# Patient Record
Sex: Female | Born: 1955 | Hispanic: Yes | Smoking: Never smoker
Health system: Southern US, Community
[De-identification: ages and names within clinical notes are randomized; demographics above are authoritative.]

## PROBLEM LIST (undated history)

## (undated) DIAGNOSIS — I10 Essential (primary) hypertension: Secondary | ICD-10-CM

## (undated) HISTORY — DX: Essential (primary) hypertension: I10

## (undated) HISTORY — PX: BLADDER REMOVAL: SHX567

## (undated) HISTORY — PX: CHOLECYSTECTOMY: SHX55

## (undated) HISTORY — PX: TUBAL LIGATION: SHX77

---

## 2014-11-12 LAB — HM COLONOSCOPY

## 2019-03-15 ENCOUNTER — Other Ambulatory Visit: Payer: Self-pay

## 2019-03-15 ENCOUNTER — Ambulatory Visit (INDEPENDENT_AMBULATORY_CARE_PROVIDER_SITE_OTHER): Payer: Medicare HMO | Admitting: Adult Health

## 2019-03-15 ENCOUNTER — Encounter: Payer: Self-pay | Admitting: Adult Health

## 2019-03-15 VITALS — BP 141/80 | HR 83 | Temp 97.4°F | Resp 16 | Ht 62.0 in | Wt 175.0 lb

## 2019-03-15 DIAGNOSIS — Z13820 Encounter for screening for osteoporosis: Secondary | ICD-10-CM

## 2019-03-15 DIAGNOSIS — Z1231 Encounter for screening mammogram for malignant neoplasm of breast: Secondary | ICD-10-CM | POA: Diagnosis not present

## 2019-03-15 DIAGNOSIS — Z6832 Body mass index (BMI) 32.0-32.9, adult: Secondary | ICD-10-CM

## 2019-03-15 DIAGNOSIS — I10 Essential (primary) hypertension: Secondary | ICD-10-CM

## 2019-03-15 DIAGNOSIS — Z7689 Persons encountering health services in other specified circumstances: Secondary | ICD-10-CM

## 2019-03-15 MED ORDER — LOSARTAN POTASSIUM 25 MG PO TABS: 25.0000 mg | ORAL_TABLET | Freq: Every day | ORAL | 0 refills | Status: DC

## 2019-03-15 NOTE — Progress Notes (Signed)
[facility] [place], [place] [address]  Internal MEDICINE  Office Visit Note  Patient Name: Krystal Boyd  [id number]  [id number]  Date of Service: 05/01/2019   Complaints/HPI Pt is here for establishment of PCP. Chief Complaint  Patient presents with  . New Patient (Initial Visit)  . Hypertension   HPI Pt is here to establish care. She is in the exam room with her husband.  They just recently moved to the area from [place]. She has a history of HTN, for which she currently takes losartan 25mg  daily.  She Denies Chest pain, Shortness of breath, palpitations, headache, or blurred vision. Her bp is initially 141/80 today.  She reports good medication compliance. The only surgical history she reports is gall bladder removal.  She denies any other medical problems.  Does not believe she has ever been on other medications daily.      Current Medication: Outpatient Encounter Medications as of 03/15/2019  Medication Sig  . losartan (COZAAR) 25 MG tablet Take 1 tablet (25 mg total) by mouth daily.  . [DISCONTINUED] losartan (COZAAR) 25 MG tablet Take 25 mg by mouth daily.   No facility-administered encounter medications on file as of 03/15/2019.    Surgical History: Past Surgical History:  Procedure Laterality Date  . BLADDER REMOVAL    . CHOLECYSTECTOMY      Medical History: Past Medical History:  Diagnosis Date  . Hypertension     Family History: Family History  Problem Relation Age of Onset  . Diabetes Mother   . Breast cancer Sister     Social History   Socioeconomic History  . Marital status: Married    Spouse name: Not on file  . Number of children: Not on file  . Years of education: Not on file  . Highest education level: Not on file  Occupational History  . Not on file  Tobacco Use  . Smoking status: Never Smoker  . Smokeless tobacco: Never Used  Substance and Sexual Activity  . Alcohol use: Yes    Comment: occ  . Drug use: Never   . Sexual activity: Not on file  Other Topics Concern  . Not on file  Social History Narrative  . Not on file   Social Determinants of Health   Financial Resource Strain:   . Difficulty of Paying Living Expenses:   Food Insecurity:   . Worried About Charity fundraiser in the Last Year:   . Arboriculturist in the Last Year:   Transportation Needs:   . Film/video editor (Medical):   Marland Kitchen Lack of Transportation (Non-Medical):   Physical Activity:   . Days of Exercise per Week:   . Minutes of Exercise per Session:   Stress:   . Feeling of Stress :   Social Connections:   . Frequency of Communication with Friends and Family:   . Frequency of Social Gatherings with Friends and Family:   . Attends Religious Services:   . Active Member of Clubs or Organizations:   . Attends Archivist Meetings:   Marland Kitchen Marital Status:   Intimate Partner Violence:   . Fear of Current or Ex-Partner:   . Emotionally Abused:   Marland Kitchen Physically Abused:   . Sexually Abused:      Review of Systems  Constitutional: Negative for chills, fatigue and unexpected weight change.  HENT: Negative for congestion, rhinorrhea, sneezing and sore throat.   Eyes: Negative for photophobia, pain and redness.  Respiratory: Negative for cough, chest tightness and shortness of breath.   Cardiovascular: Negative for chest pain and palpitations.  Gastrointestinal: Negative for abdominal pain, constipation, diarrhea, nausea and vomiting.  Endocrine: Negative.   Genitourinary: Negative for dysuria and frequency.  Musculoskeletal: Negative for arthralgias, back pain, joint swelling and neck pain.  Skin: Negative for rash.  Allergic/Immunologic: Negative.   Neurological: Negative for tremors and numbness.  Hematological: Negative for adenopathy. Does not bruise/bleed easily.  Psychiatric/Behavioral: Negative for behavioral problems and sleep disturbance. The patient is not nervous/anxious.     Vital Signs: BP (!)  141/80 Comment: second reading  Pulse 83   Temp (!) 97.4 F (36.3 C)   Resp 16   Ht 5\' 2"  (1.575 m)   Wt 175 lb (79.4 kg)   SpO2 97%   BMI 32.01 kg/m    Physical Exam Vitals and nursing note reviewed.  Constitutional:      General: She is not in acute distress.    Appearance: She is well-developed. She is not diaphoretic.  HENT:     Head: Normocephalic and atraumatic.     Mouth/Throat:     Pharynx: No oropharyngeal exudate.  Eyes:     Pupils: Pupils are equal, round, and reactive to light.  Neck:     Thyroid: No thyromegaly.     Vascular: No JVD.     Trachea: No tracheal deviation.  Cardiovascular:     Rate and Rhythm: Normal rate and regular rhythm.     Heart sounds: Normal heart sounds. No murmur. No friction rub. No gallop.   Pulmonary:     Effort: Pulmonary effort is normal. No respiratory distress.     Breath sounds: Normal breath sounds. No wheezing or rales.  Chest:     Chest wall: No tenderness.  Abdominal:     Palpations: Abdomen is soft.     Tenderness: There is no abdominal tenderness. There is no guarding.  Musculoskeletal:        General: Normal range of motion.     Cervical back: Normal range of motion and neck supple.  Lymphadenopathy:     Cervical: No cervical adenopathy.  Skin:    General: Skin is warm and dry.  Neurological:     Mental Status: She is alert and oriented to person, place, and time.     Cranial Nerves: No cranial nerve deficit.  Psychiatric:        Behavior: Behavior normal.        Thought Content: Thought content normal.        Judgment: Judgment normal.    Assessment/Plan: 1. Essential hypertension Refilled patients losartan as discussed.  Continue present management.  - losartan (COZAAR) 25 MG tablet; Take 1 tablet (25 mg total) by mouth daily.  Dispense: 90 tablet; Refill: 0  2. BMI 32.0-32.9,adult Obesity Counseling: Risk Assessment: An assessment of behavioral risk factors was made today and includes lack of exercise  sedentary lifestyle, lack of portion control and poor dietary habits.  Risk Modification Advice: She was counseled on portion control guidelines. Restricting daily caloric intake to 1800. The detrimental long term effects of obesity on her health and ongoing poor compliance was also discussed with the patient.  3. Encounter for screening mammogram for malignant neoplasm of breast - MM DIGITAL SCREENING BILATERAL; Future  4. Screening for osteoporosis - DG Bone Density; Future  5. Encounter to establish care with new doctor - CBC with Differential/Platelet - Lipid Panel With LDL/HDL Ratio - TSH - T4,  free - Comprehensive metabolic panel - Hemoglobin A1c - B12 and Folate Panel - Vitamin D (25 hydroxy) - Fe+TIBC+Fer  General Counseling: Rickita verbalizes understanding of the findings of todays visit and agrees with plan of treatment. I have discussed any further diagnostic evaluation that may be needed or ordered today. We also reviewed her medications today. she has been encouraged to call the office with any questions or concerns that should arise related to todays visit.  Orders Placed This Encounter  Procedures  . DG Bone Density  . CBC with Differential/Platelet  . Lipid Panel With LDL/HDL Ratio  . TSH  . T4, free  . Comprehensive metabolic panel  . Hemoglobin A1c  . B12 and Folate Panel  . Vitamin D (25 hydroxy)  . Fe+TIBC+Fer    Meds ordered this encounter  Medications  . losartan (COZAAR) 25 MG tablet    Sig: Take 1 tablet (25 mg total) by mouth daily.    Dispense:  90 tablet    Refill:  0    Time spent: 30 Minutes   This patient was seen by Blima Ledger AGNP-C in Collaboration with Dr Lyndon Code as a part of collaborative care agreement  Johnna Acosta AGNP-C Internal Medicine

## 2019-03-16 DIAGNOSIS — E755 Other lipid storage disorders: Secondary | ICD-10-CM | POA: Diagnosis not present

## 2019-03-16 DIAGNOSIS — E611 Iron deficiency: Secondary | ICD-10-CM | POA: Diagnosis not present

## 2019-03-16 DIAGNOSIS — D510 Vitamin B12 deficiency anemia due to intrinsic factor deficiency: Secondary | ICD-10-CM | POA: Diagnosis not present

## 2019-03-16 DIAGNOSIS — D509 Iron deficiency anemia, unspecified: Secondary | ICD-10-CM | POA: Diagnosis not present

## 2019-03-16 DIAGNOSIS — R5383 Other fatigue: Secondary | ICD-10-CM | POA: Diagnosis not present

## 2019-03-16 DIAGNOSIS — Z833 Family history of diabetes mellitus: Secondary | ICD-10-CM | POA: Diagnosis not present

## 2019-03-16 DIAGNOSIS — Z7689 Persons encountering health services in other specified circumstances: Secondary | ICD-10-CM | POA: Diagnosis not present

## 2019-03-17 LAB — CBC WITH DIFFERENTIAL/PLATELET
Basophils Absolute: 0.1 10*3/uL (ref 0.0–0.2)
Basos: 1 %
EOS (ABSOLUTE): 0.2 10*3/uL (ref 0.0–0.4)
Eos: 2 %
Hematocrit: 43.5 % (ref 34.0–46.6)
Hemoglobin: 14.3 g/dL (ref 11.1–15.9)
Immature Grans (Abs): 0 10*3/uL (ref 0.0–0.1)
Immature Granulocytes: 0 %
Lymphocytes Absolute: 2.8 10*3/uL (ref 0.7–3.1)
Lymphs: 41 %
MCH: 26.9 pg (ref 26.6–33.0)
MCHC: 32.9 g/dL (ref 31.5–35.7)
MCV: 82 fL (ref 79–97)
Monocytes Absolute: 0.5 10*3/uL (ref 0.1–0.9)
Monocytes: 8 %
Neutrophils Absolute: 3.4 10*3/uL (ref 1.4–7.0)
Neutrophils: 48 %
Platelets: 274 10*3/uL (ref 150–450)
RBC: 5.31 x10E6/uL — ABNORMAL HIGH (ref 3.77–5.28)
RDW: 13.5 % (ref 11.7–15.4)
WBC: 6.9 10*3/uL (ref 3.4–10.8)

## 2019-03-17 LAB — IRON,TIBC AND FERRITIN PANEL
Ferritin: 143 ng/mL (ref 15–150)
Iron Saturation: 33 % (ref 15–55)
Iron: 110 ug/dL (ref 27–139)
Total Iron Binding Capacity: 331 ug/dL (ref 250–450)
UIBC: 221 ug/dL (ref 118–369)

## 2019-03-17 LAB — B12 AND FOLATE PANEL
Folate: >20 ng/mL (ref >3.0)
Vitamin B-12: 647 pg/mL (ref 232–1245)

## 2019-03-17 LAB — COMPREHENSIVE METABOLIC PANEL
ALT: 17 IU/L (ref 0–32)
AST: 17 IU/L (ref 0–40)
Albumin/Globulin Ratio: 1.4 (ref 1.2–2.2)
Albumin: 4.2 g/dL (ref 3.8–4.8)
Alkaline Phosphatase: 96 IU/L (ref 39–117)
BUN/Creatinine Ratio: 17 (ref 12–28)
BUN: 11 mg/dL (ref 8–27)
Bilirubin Total: 0.3 mg/dL (ref 0.0–1.2)
CO2: 19 mmol/L — ABNORMAL LOW (ref 20–29)
Calcium: 9.3 mg/dL (ref 8.7–10.3)
Chloride: 104 mmol/L (ref 96–106)
Creatinine, Ser: 0.65 mg/dL (ref 0.57–1.00)
GFR calc Af Amer: 108 mL/min/{1.73_m2} (ref >59)
GFR calc non Af Amer: 93 mL/min/{1.73_m2} (ref >59)
Globulin, Total: 2.9 g/dL (ref 1.5–4.5)
Glucose: 133 mg/dL — ABNORMAL HIGH (ref 65–99)
Potassium: 4.7 mmol/L (ref 3.5–5.2)
Sodium: 139 mmol/L (ref 134–144)
Total Protein: 7.1 g/dL (ref 6.0–8.5)

## 2019-03-17 LAB — LIPID PANEL WITH LDL/HDL RATIO
Cholesterol, Total: 181 mg/dL (ref 100–199)
HDL: 41 mg/dL (ref >39)
LDL Chol Calc (NIH): 115 mg/dL — ABNORMAL HIGH (ref 0–99)
LDL/HDL Ratio: 2.8 ratio (ref 0.0–3.2)
Triglycerides: 140 mg/dL (ref 0–149)
VLDL Cholesterol Cal: 25 mg/dL (ref 5–40)

## 2019-03-17 LAB — T4, FREE: Free T4: 1.27 ng/dL (ref 0.82–1.77)

## 2019-03-17 LAB — HEMOGLOBIN A1C
Est. average glucose Bld gHb Est-mCnc: 137 mg/dL
Hgb A1c MFr Bld: 6.4 % — ABNORMAL HIGH (ref 4.8–5.6)

## 2019-03-17 LAB — VITAMIN D 25 HYDROXY (VIT D DEFICIENCY, FRACTURES): Vit D, 25-Hydroxy: 38.2 ng/mL (ref 30.0–100.0)

## 2019-03-17 LAB — TSH: TSH: 2.5 u[IU]/mL (ref 0.450–4.500)

## 2019-04-04 ENCOUNTER — Other Ambulatory Visit: Payer: Self-pay | Admitting: Adult Health

## 2019-04-04 DIAGNOSIS — Z1231 Encounter for screening mammogram for malignant neoplasm of breast: Secondary | ICD-10-CM

## 2019-04-25 ENCOUNTER — Encounter: Payer: Self-pay | Admitting: Radiology

## 2019-04-25 ENCOUNTER — Ambulatory Visit
Admission: RE | Admit: 2019-04-25 | Discharge: 2019-04-25 | Disposition: A | Discharge status: Home / Self Care | Payer: Medicare HMO | Source: Ambulatory Visit | Attending: Adult Health | Admitting: Adult Health

## 2019-04-25 ENCOUNTER — Telehealth: Payer: Self-pay

## 2019-04-25 DIAGNOSIS — Z13820 Encounter for screening for osteoporosis: Secondary | ICD-10-CM | POA: Diagnosis not present

## 2019-04-25 DIAGNOSIS — Z1231 Encounter for screening mammogram for malignant neoplasm of breast: Secondary | ICD-10-CM | POA: Diagnosis not present

## 2019-04-25 DIAGNOSIS — Z780 Asymptomatic menopausal state: Secondary | ICD-10-CM | POA: Diagnosis not present

## 2019-04-25 NOTE — Telephone Encounter (Signed)
lmom to confirm and screen for 04-27-19 ov. 

## 2019-04-27 ENCOUNTER — Ambulatory Visit: Payer: Medicare HMO | Admitting: Nurse Practitioner

## 2019-04-28 ENCOUNTER — Telehealth: Payer: Self-pay

## 2019-04-28 NOTE — Telephone Encounter (Signed)
BILLED MISSED APPOINTMENT FEE 04/27/2019

## 2019-05-03 NOTE — Progress Notes (Signed)
Pt has missed app, will dicsuss BMD on her visit for CPE

## 2019-05-15 ENCOUNTER — Telehealth: Payer: Self-pay

## 2019-05-15 NOTE — Telephone Encounter (Signed)
Lmom for patient to contact our office to rs missed appt on 04/27/2019.

## 2019-06-06 ENCOUNTER — Other Ambulatory Visit: Payer: Self-pay | Admitting: Adult Health

## 2019-06-06 DIAGNOSIS — I10 Essential (primary) hypertension: Secondary | ICD-10-CM

## 2019-06-07 ENCOUNTER — Telehealth: Payer: Self-pay

## 2019-06-07 NOTE — Telephone Encounter (Signed)
Tried contacting patient to reschedule missed appointment for annual wellness visit. klh

## 2020-05-15 IMAGING — MG DIGITAL SCREENING BILAT W/ TOMO W/ CAD
8 series · 8 of 24 positions shown · non-contrast
Comparison: None.

CLINICAL DATA: Screening.

EXAM:
DIGITAL SCREENING BILATERAL MAMMOGRAM WITH TOMO AND CAD

[L MLO synth-2D]
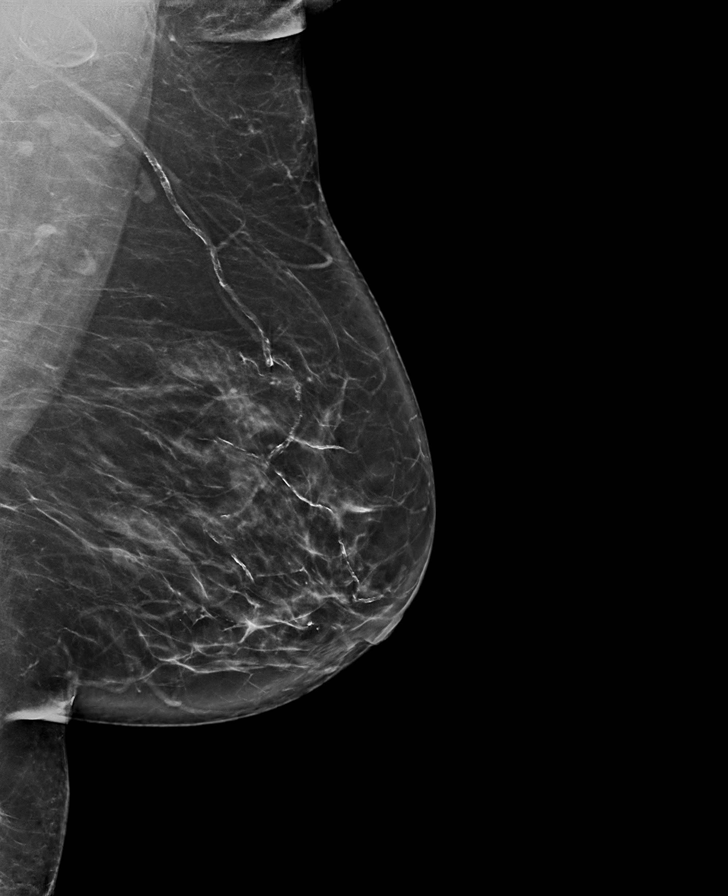

[L CC synth-2D]
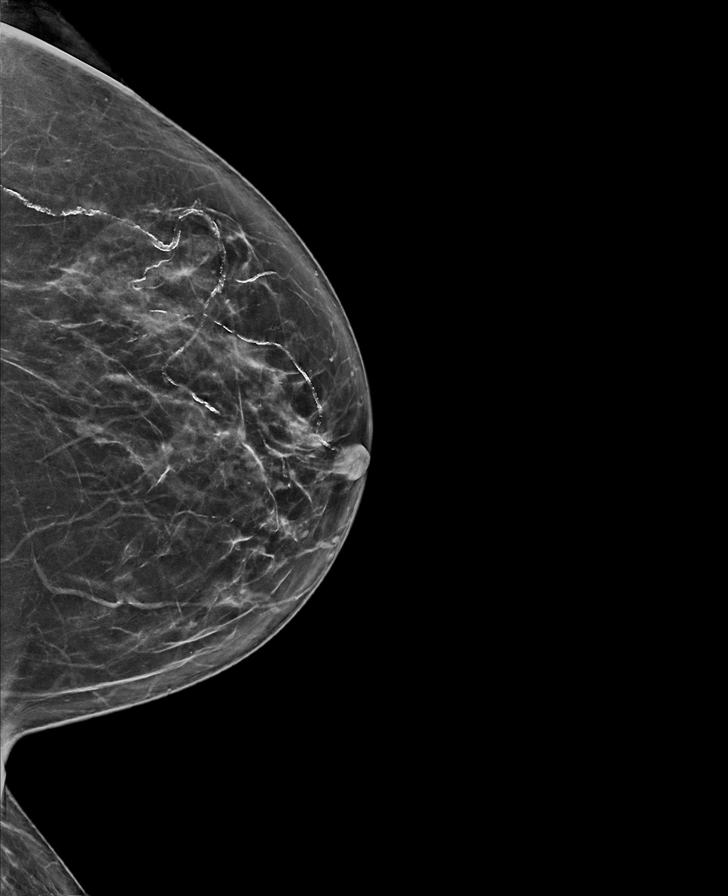

[R MLO synth-2D]
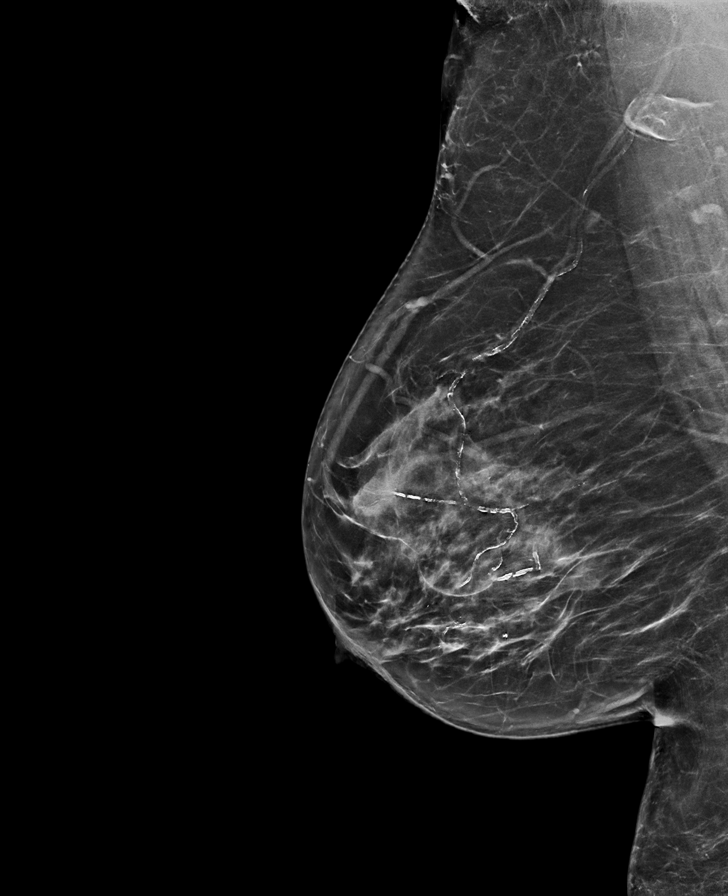

[R CC synth-2D]
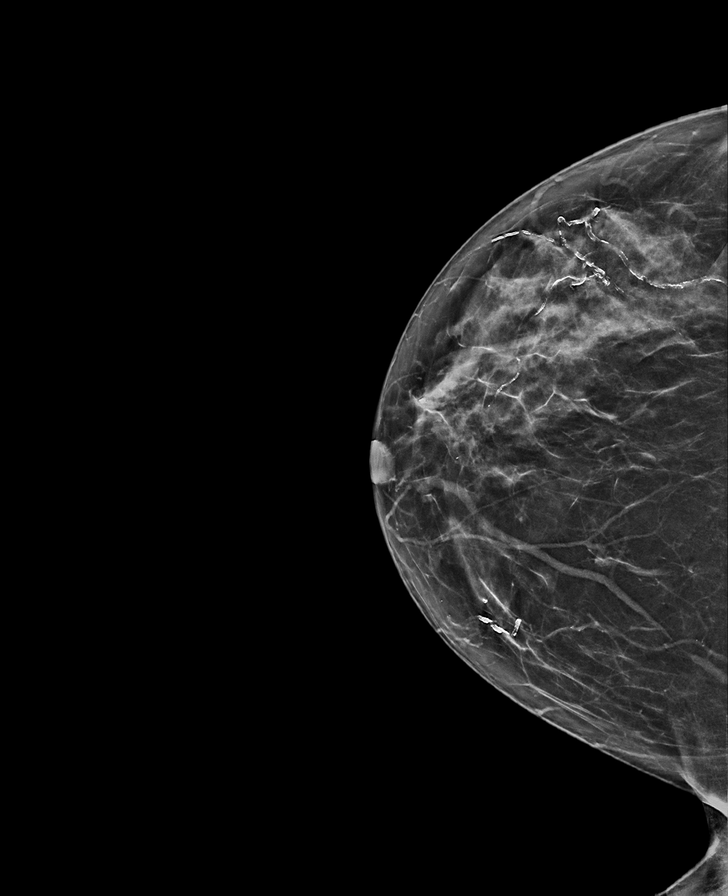

[R CC tomo · tomo slice 33/66.0]
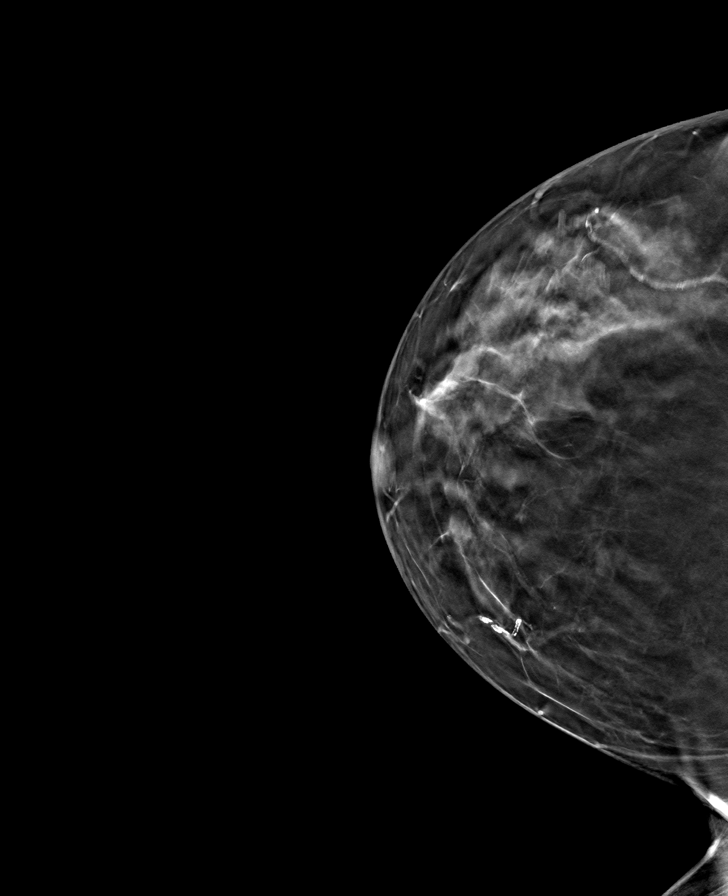

[L CC tomo · tomo slice 39/76.0]
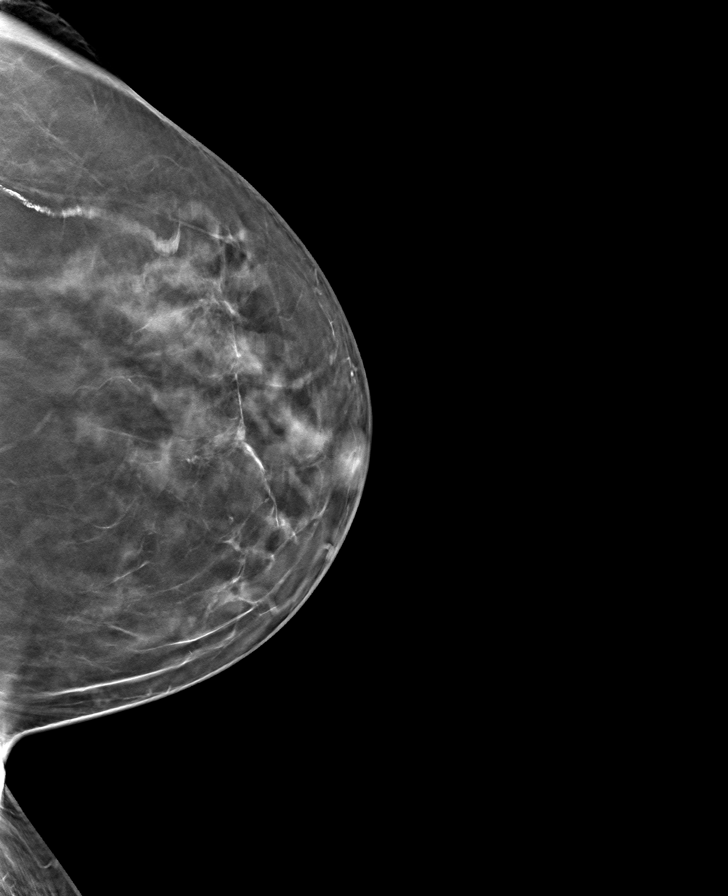

[L MLO tomo · tomo slice 41/82.0]
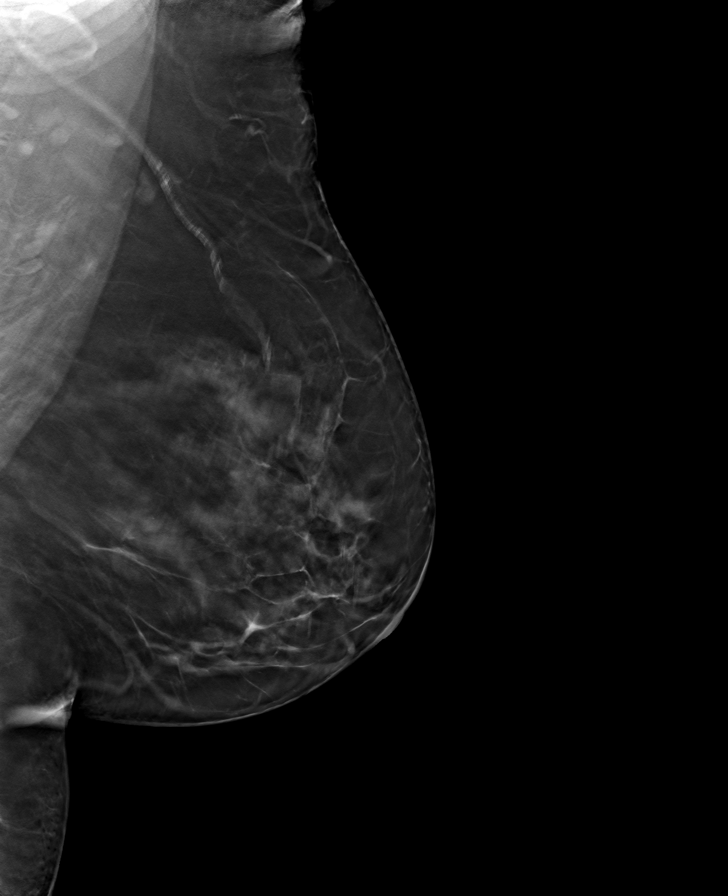

[R MLO tomo · tomo slice 39/76.0]
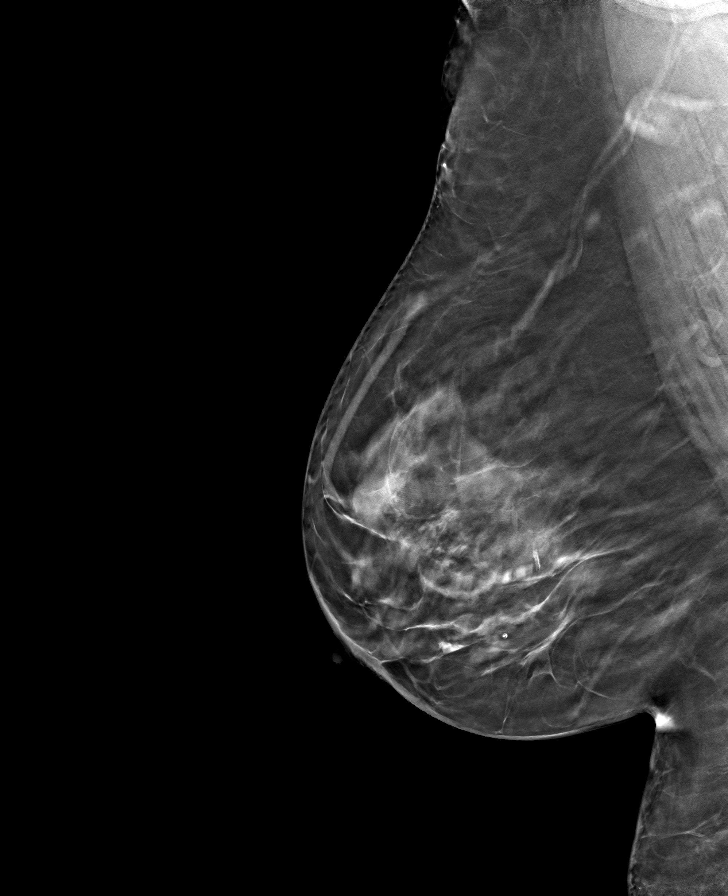

[8 of 24 positions shown; findings below may reference images not displayed]

ACR Breast Density Category b: There are scattered areas of
fibroglandular density.
FINDINGS: There are no findings suspicious for malignancy. Images were
processed with CAD.
IMPRESSION: No mammographic evidence of malignancy. A result letter of this
screening mammogram will be mailed directly to the patient.

RECOMMENDATION:
Screening mammogram in one year. (Code:Y5-G-EJ6)

BI-RADS CATEGORY  1: Negative.

## 2021-01-02 ENCOUNTER — Encounter: Payer: Self-pay | Admitting: Internal Medicine

## 2021-02-05 ENCOUNTER — Ambulatory Visit (INDEPENDENT_AMBULATORY_CARE_PROVIDER_SITE_OTHER): Payer: Medicare HMO | Admitting: Internal Medicine

## 2021-02-05 ENCOUNTER — Encounter: Payer: Self-pay | Admitting: Internal Medicine

## 2021-02-05 VITALS — BP 164/98 | HR 89 | Temp 98.4°F | Resp 16 | Ht 60.0 in | Wt 178.5 lb

## 2021-02-05 DIAGNOSIS — Z23 Encounter for immunization: Secondary | ICD-10-CM | POA: Diagnosis not present

## 2021-02-05 DIAGNOSIS — E1169 Type 2 diabetes mellitus with other specified complication: Secondary | ICD-10-CM

## 2021-02-05 DIAGNOSIS — E785 Hyperlipidemia, unspecified: Secondary | ICD-10-CM | POA: Diagnosis not present

## 2021-02-05 DIAGNOSIS — E782 Mixed hyperlipidemia: Secondary | ICD-10-CM | POA: Diagnosis not present

## 2021-02-05 DIAGNOSIS — E1165 Type 2 diabetes mellitus with hyperglycemia: Secondary | ICD-10-CM

## 2021-02-05 DIAGNOSIS — Z1231 Encounter for screening mammogram for malignant neoplasm of breast: Secondary | ICD-10-CM | POA: Diagnosis not present

## 2021-02-05 DIAGNOSIS — Z1159 Encounter for screening for other viral diseases: Secondary | ICD-10-CM | POA: Diagnosis not present

## 2021-02-05 DIAGNOSIS — R7303 Prediabetes: Secondary | ICD-10-CM | POA: Diagnosis not present

## 2021-02-05 DIAGNOSIS — Z13220 Encounter for screening for lipoid disorders: Secondary | ICD-10-CM

## 2021-02-05 DIAGNOSIS — I10 Essential (primary) hypertension: Secondary | ICD-10-CM | POA: Diagnosis not present

## 2021-02-05 MED ORDER — LOSARTAN POTASSIUM 25 MG PO TABS: 25.0000 mg | ORAL_TABLET | Freq: Every day | ORAL | 3 refills | Status: DC

## 2021-02-05 MED ORDER — ADULT BLOOD PRESSURE CUFF LG KIT: 1.0000 | PACK | Freq: Every day | 0 refills | Status: DC

## 2021-02-05 NOTE — Patient Instructions (Addendum)
It was great seeing you today!  Plan discussed at today's visit: -Blood work ordered today, results will be uploaded to MyChart.  -Mammogram ordered -Blood pressure medication sent to pharmacy, continue to check BP at home and write this down to bring to next appointment -Pneumonia vaccine given today  Follow up in: 1 month   Take care and let us know if you have any questions or concerns prior to your next visit.  Dr. Caralee Ates

## 2021-02-05 NOTE — Progress Notes (Signed)
New Patient Office Visit  Subjective:  Patient ID: Krystal Boyd, female    DOB: 16-Nov-1955  Age: 66 y.o. MRN: [id number]  CC:  Chief Complaint  Patient presents with   Establish Care   Hypertension    HPI Krystal Boyd presents to establish care. Chronic medical conditions include hypertension.  Hypertension: -Medications: None now but had been on Losartan 25 in the past, denied side effects just didn't to take it -Patient is compliant with above medications and reports no side effects. -Checking BP at home (average): does check at home 164/85 yesterday  -Denies any SOB, CP, vision changes, LE edema or symptoms of hypotension  Pre-Diabetes: -Last A1c 6.4% 3/21 -Not currently on any medications  Health Maintenance: -Blood work due -Mammogram 4/21 Birads 1 -Colon cancer screening - colonoscopy in [place] less than 10 years ago, uncertain when to repeat  -Vaccines: politely declines flu vaccine, willing to do Prevnar 20  Past Medical History:  Diagnosis Date   Hypertension     Past Surgical History:  Procedure Laterality Date   BLADDER REMOVAL     CHOLECYSTECTOMY     TUBAL LIGATION      Family History  Problem Relation Age of Onset   Diabetes Mother    Breast cancer Sister     Social History   Socioeconomic History   Marital status: Significant Other    Spouse name: Not on file   Number of children: Not on file   Years of education: Not on file   Highest education level: Not on file  Occupational History   Not on file  Tobacco Use   Smoking status: Never   Smokeless tobacco: Never  Vaping Use   Vaping Use: Never used  Substance and Sexual Activity   Alcohol use: Yes    Comment: occ   Drug use: Never   Sexual activity: Yes  Other Topics Concern   Not on file  Social History Narrative   Not on file   Social Determinants of Health   Financial Resource Strain: Not on file  Food Insecurity: Not on file  Transportation Needs: Not on file  Physical  Activity: Not on file  Stress: Not on file  Social Connections: Not on file  Intimate Partner Violence: Not on file    ROS Review of Systems  Constitutional:  Negative for chills and fever.  Eyes:  Negative for visual disturbance.  Respiratory:  Negative for cough and shortness of breath.   Cardiovascular:  Negative for chest pain.  Gastrointestinal:  Negative for abdominal pain.  Neurological:  Negative for dizziness and headaches.   Objective:   Today's Vitals: BP (!) 164/98    Pulse 89    Temp 98.4 F (36.9 C)    Resp 16    Ht 5' (1.524 m)    Wt 178 lb 8 oz (81 kg)    SpO2 97%    BMI 34.86 kg/m   Physical Exam Constitutional:      Appearance: Normal appearance.  HENT:     Head: Normocephalic and atraumatic.  Eyes:     Conjunctiva/sclera: Conjunctivae normal.  Cardiovascular:     Rate and Rhythm: Normal rate and regular rhythm.  Pulmonary:     Effort: Pulmonary effort is normal.     Breath sounds: Normal breath sounds.  Musculoskeletal:     Right lower leg: No edema.     Left lower leg: No edema.  Skin:    General: Skin is warm and dry.  Neurological:     General: No focal deficit present.     Mental Status: She is alert. Mental status is at baseline.  Psychiatric:        Mood and Affect: Mood normal.        Behavior: Behavior normal.    Assessment & Plan:   1. Hypertension, unspecified type: BP high today but not taking any medications. She had been on Losartan in the past and did well but stopped taking it because she didn't think she needed it. We discussed why it's important she be on something for blood pressure and the risks that come with not managing blood pressure appropriately. She needs a larger blood pressure cuff for home to monitor BP with, order placed. Routine labs ordered today as well. Follow up in 1 month to recheck.  - CBC w/Diff/Platelet - COMPLETE METABOLIC PANEL WITH GFR - losartan (COZAAR) 25 MG tablet; Take 1 tablet (25 mg total) by mouth  daily.  Dispense: 90 tablet; Refill: 3 - Blood Pressure Monitoring (ADULT BLOOD PRESSURE CUFF LG) KIT; 1 each by Does not apply route daily.  Dispense: 1 kit; Refill: 0  2. Prediabetes: Last A1c 6.4% in 2021, will recheck today.   - HgB A1c  3. Lipid screening/Need for hepatitis C screening test: Screening for lipid disorders, Hepatitis C today as well.   - Lipid Profile - Hepatitis C Antibody  4. Encounter for screening mammogram for malignant neoplasm of breast: Mammogram ordered today.  - MM Digital Diagnostic Bilat; Future  5. Vaccine for streptococcus pneumoniae and influenza: Prevnar 20 vaccine administered today.  - Pneumococcal conjugate vaccine 20-valent (Prevnar 20)   Follow-up: Return in about 4 weeks (around 03/05/2021).   Krystal Medici, DO

## 2021-02-06 LAB — COMPLETE METABOLIC PANEL WITH GFR
AG Ratio: 1.3 (calc) (ref 1.0–2.5)
ALT: 17 U/L (ref 6–29)
AST: 15 U/L (ref 10–35)
Albumin: 4.3 g/dL (ref 3.6–5.1)
Alkaline phosphatase (APISO): 94 U/L (ref 37–153)
BUN: 9 mg/dL (ref 7–25)
CO2: 27 mmol/L (ref 20–32)
Calcium: 9.8 mg/dL (ref 8.6–10.4)
Chloride: 101 mmol/L (ref 98–110)
Creat: 0.76 mg/dL (ref 0.50–1.05)
Globulin: 3.3 g/dL (calc) (ref 1.9–3.7)
Glucose, Bld: 143 mg/dL — ABNORMAL HIGH (ref 65–99)
Potassium: 4.7 mmol/L (ref 3.5–5.3)
Sodium: 137 mmol/L (ref 135–146)
Total Bilirubin: 0.4 mg/dL (ref 0.2–1.2)
Total Protein: 7.6 g/dL (ref 6.1–8.1)
eGFR: 86 mL/min/{1.73_m2} (ref >=60)

## 2021-02-06 LAB — CBC WITH DIFFERENTIAL/PLATELET
Absolute Monocytes: 593 cells/uL (ref 200–950)
Basophils Absolute: 61 cells/uL (ref 0–200)
Basophils Relative: 0.8 %
Eosinophils Absolute: 106 cells/uL (ref 15–500)
Eosinophils Relative: 1.4 %
HCT: 45.6 % — ABNORMAL HIGH (ref 35.0–45.0)
Hemoglobin: 14.8 g/dL (ref 11.7–15.5)
Lymphs Abs: 2827 cells/uL (ref 850–3900)
MCH: 26.7 pg — ABNORMAL LOW (ref 27.0–33.0)
MCHC: 32.5 g/dL (ref 32.0–36.0)
MCV: 82.2 fL (ref 80.0–100.0)
MPV: 11.5 fL (ref 7.5–12.5)
Monocytes Relative: 7.8 %
Neutro Abs: 4013 cells/uL (ref 1500–7800)
Neutrophils Relative %: 52.8 %
Platelets: 199 10*3/uL (ref 140–400)
RBC: 5.55 10*6/uL — ABNORMAL HIGH (ref 3.80–5.10)
RDW: 13.2 % (ref 11.0–15.0)
Total Lymphocyte: 37.2 %
WBC: 7.6 10*3/uL (ref 3.8–10.8)

## 2021-02-06 LAB — LIPID PANEL
Cholesterol: 197 mg/dL (ref <200)
HDL: 40 mg/dL — ABNORMAL LOW (ref >=50)
LDL Cholesterol (Calc): 123 mg/dL (calc) — ABNORMAL HIGH
Non-HDL Cholesterol (Calc): 157 mg/dL (calc) — ABNORMAL HIGH (ref <130)
Total CHOL/HDL Ratio: 4.9 (calc) (ref <5.0)
Triglycerides: 217 mg/dL — ABNORMAL HIGH (ref <150)

## 2021-02-06 LAB — HEPATITIS C ANTIBODY
Hepatitis C Ab: NONREACTIVE
SIGNAL TO CUT-OFF: 0.02 (ref <1.00)

## 2021-02-06 LAB — HEMOGLOBIN A1C
Hgb A1c MFr Bld: 7.5 % of total Hgb — ABNORMAL HIGH (ref <5.7)
Mean Plasma Glucose: 169 mg/dL
eAG (mmol/L): 9.3 mmol/L

## 2021-02-07 DIAGNOSIS — E119 Type 2 diabetes mellitus without complications: Secondary | ICD-10-CM | POA: Insufficient documentation

## 2021-02-07 DIAGNOSIS — E785 Hyperlipidemia, unspecified: Secondary | ICD-10-CM | POA: Insufficient documentation

## 2021-02-07 MED ORDER — ROSUVASTATIN CALCIUM 20 MG PO TABS
20.0000 mg | ORAL_TABLET | Freq: Every day | ORAL | 3 refills | Status: DC
Start: 2021-02-07 — End: 2021-05-06

## 2021-02-07 MED ORDER — METFORMIN HCL 500 MG PO TABS: 500.0000 mg | ORAL_TABLET | Freq: Two times a day (BID) | ORAL | 3 refills | Status: DC

## 2021-02-07 NOTE — Addendum Note (Signed)
Addended by: Margarita Mail on: 02/07/2021 12:43 PM   Modules accepted: Orders

## 2021-02-14 ENCOUNTER — Other Ambulatory Visit: Payer: Self-pay | Admitting: Internal Medicine

## 2021-02-14 DIAGNOSIS — Z1231 Encounter for screening mammogram for malignant neoplasm of breast: Secondary | ICD-10-CM

## 2021-02-26 DIAGNOSIS — H35033 Hypertensive retinopathy, bilateral: Secondary | ICD-10-CM | POA: Diagnosis not present

## 2021-02-26 DIAGNOSIS — Z0100 Encounter for examination of eyes and vision without abnormal findings: Secondary | ICD-10-CM | POA: Diagnosis not present

## 2021-02-26 DIAGNOSIS — H524 Presbyopia: Secondary | ICD-10-CM | POA: Diagnosis not present

## 2021-02-26 LAB — HM DIABETES EYE EXAM

## 2021-03-05 ENCOUNTER — Encounter: Payer: Self-pay | Admitting: Internal Medicine

## 2021-03-05 ENCOUNTER — Ambulatory Visit (INDEPENDENT_AMBULATORY_CARE_PROVIDER_SITE_OTHER): Payer: Medicare HMO | Admitting: Internal Medicine

## 2021-03-05 ENCOUNTER — Other Ambulatory Visit: Payer: Self-pay

## 2021-03-05 DIAGNOSIS — E782 Mixed hyperlipidemia: Secondary | ICD-10-CM

## 2021-03-05 DIAGNOSIS — E1165 Type 2 diabetes mellitus with hyperglycemia: Secondary | ICD-10-CM | POA: Diagnosis not present

## 2021-03-05 DIAGNOSIS — I10 Essential (primary) hypertension: Secondary | ICD-10-CM

## 2021-03-05 NOTE — Progress Notes (Signed)
Established Patient Office Visit  Subjective:  Patient ID: Krystal Boyd, female    DOB: 1955/01/26  Age: 66 y.o. MRN: [id number]  CC:  Chief Complaint  Patient presents with   Hypertension   Diabetes    HPI Krystal Boyd presents for follow up on chronic medical conditions.   Hypertension: -Medications: Losartan 25 restarted at [place] -Patient is compliant with above medications and reports no side effects. -Checking BP at home (average): 130-145/75-90 -Denies any SOB, CP, vision changes, LE edema or symptoms of hypotension   Diabetes type 2: -Last A1c 7.5% 1/23 -Started Metformin 500 BID  -Patient is compliant with the above medications and reports no side effects.  -Diet: made diet change to reduce sugars and carbs since last visit  -Eye exam: Had one last week  02/26/21 -Foot exam: Today  -Microalbumin: Due -Statin: yes -PNA vaccine: yes -Denies symptoms of hypoglycemia, polyuria, polydipsia, numbness extremities, foot ulcers/trauma.   HLD: -Medications: Crestor 20 -Patient is compliant with above medications and reports no side effects. -Last lipid panel: Lipid Panel     Component Value Date/Time   CHOL 197 02/05/2021 1142   CHOL 181 03/16/2019 0855   TRIG 217 (H) 02/05/2021 1142   HDL 40 (L) 02/05/2021 1142   HDL 41 03/16/2019 0855   CHOLHDL 4.9 02/05/2021 1142   LDLCALC 123 (H) 02/05/2021 1142   LABVLDL 25 03/16/2019 0855    The 10-year ASCVD risk score (Arnett DK, et al., 2019) is: 28.9%   Values used to calculate the score:     Age: 41 years     Sex: Female     Is Non-Hispanic African American: No     Diabetic: Yes     Tobacco smoker: No     Systolic Blood Pressure: 037 mmHg     Is BP treated: Yes     HDL Cholesterol: 40 mg/dL     Total Cholesterol: 197 mg/dL  Health Maintenance: -Blood work UTD -Mammogram 4/21 Birads 1, mammogram scheduled for this year -Colon cancer screening - colonoscopy in [place] less than 10 years ago, uncertain when to repeat    Past Medical History:  Diagnosis Date   Hypertension     Past Surgical History:  Procedure Laterality Date   BLADDER REMOVAL     CHOLECYSTECTOMY     TUBAL LIGATION      Family History  Problem Relation Age of Onset   Diabetes Mother    Breast cancer Sister     Social History   Socioeconomic History   Marital status: Significant Other    Spouse name: Not on file   Number of children: Not on file   Years of education: Not on file   Highest education level: Not on file  Occupational History   Not on file  Tobacco Use   Smoking status: Never   Smokeless tobacco: Never  Vaping Use   Vaping Use: Never used  Substance and Sexual Activity   Alcohol use: Yes    Comment: occ   Drug use: Never   Sexual activity: Yes  Other Topics Concern   Not on file  Social History Narrative   Not on file   Social Determinants of Health   Financial Resource Strain: Not on file  Food Insecurity: Not on file  Transportation Needs: Not on file  Physical Activity: Not on file  Stress: Not on file  Social Connections: Not on file  Intimate Partner Violence: Not on file    Outpatient Medications Prior  to Visit  Medication Sig Dispense Refill   Blood Pressure Monitoring (ADULT BLOOD PRESSURE CUFF LG) KIT 1 each by Does not apply route daily. 1 kit 0   losartan (COZAAR) 25 MG tablet Take 1 tablet (25 mg total) by mouth daily. 90 tablet 3   metFORMIN (GLUCOPHAGE) 500 MG tablet Take 1 tablet (500 mg total) by mouth 2 (two) times daily with a meal. 180 tablet 3   rosuvastatin (CRESTOR) 20 MG tablet Take 1 tablet (20 mg total) by mouth daily. 90 tablet 3   No facility-administered medications prior to visit.    No Known Allergies  ROS Review of Systems  Constitutional:  Negative for chills and fever.  Eyes:  Negative for visual disturbance.  Respiratory:  Negative for cough and shortness of breath.   Cardiovascular:  Negative for chest pain.  Gastrointestinal:  Negative for  abdominal pain and diarrhea.     Objective:    Physical Exam Constitutional:      Appearance: Normal appearance. She is obese.  HENT:     Head: Normocephalic and atraumatic.  Eyes:     Conjunctiva/sclera: Conjunctivae normal.  Cardiovascular:     Rate and Rhythm: Normal rate and regular rhythm.     Pulses:          Dorsalis pedis pulses are 2+ on the right side and 2+ on the left side.  Pulmonary:     Effort: Pulmonary effort is normal.     Breath sounds: Normal breath sounds.  Musculoskeletal:     Right lower leg: No edema.     Left lower leg: No edema.     Right foot: Normal range of motion. No deformity, bunion, Charcot foot, foot drop or prominent metatarsal heads.     Left foot: Normal range of motion. No deformity, bunion, Charcot foot, foot drop or prominent metatarsal heads.  Feet:     Right foot:     Protective Sensation: 6 sites tested.  6 sites sensed.     Skin integrity: Skin integrity normal.     Toenail Condition: Right toenails are normal.     Left foot:     Protective Sensation: 6 sites tested.  6 sites sensed.     Skin integrity: Skin integrity normal.     Toenail Condition: Left toenails are normal.  Skin:    General: Skin is warm and dry.  Neurological:     General: No focal deficit present.     Mental Status: She is alert. Mental status is at baseline.  Psychiatric:        Mood and Affect: Mood normal.        Behavior: Behavior normal.    BP 132/82    Pulse 85    Temp 98.5 F (36.9 C)    Resp 16    Ht 5' (1.524 m)    Wt 173 lb (78.5 kg)    SpO2 97%    BMI 33.79 kg/m  Wt Readings from Last 3 Encounters:  02/05/21 178 lb 8 oz (81 kg)  03/15/19 175 lb (79.4 kg)     Health Maintenance Due  Topic Date Due   COVID-19 Vaccine (1) Never done   FOOT EXAM  Never done   OPHTHALMOLOGY EXAM  Never done    There are no preventive care reminders to display for this patient.  Lab Results  Component Value Date   TSH 2.500 03/16/2019   Lab Results   Component Value Date   WBC 7.6 02/05/2021  HGB 14.8 02/05/2021   HCT 45.6 (H) 02/05/2021   MCV 82.2 02/05/2021   PLT 199 02/05/2021   Lab Results  Component Value Date   NA 137 02/05/2021   K 4.7 02/05/2021   CO2 27 02/05/2021   GLUCOSE 143 (H) 02/05/2021   BUN 9 02/05/2021   CREATININE 0.76 02/05/2021   BILITOT 0.4 02/05/2021   ALKPHOS 96 03/16/2019   AST 15 02/05/2021   ALT 17 02/05/2021   PROT 7.6 02/05/2021   ALBUMIN 4.2 03/16/2019   CALCIUM 9.8 02/05/2021   EGFR 86 02/05/2021   Lab Results  Component Value Date   CHOL 197 02/05/2021   Lab Results  Component Value Date   HDL 40 (L) 02/05/2021   Lab Results  Component Value Date   LDLCALC 123 (H) 02/05/2021   Lab Results  Component Value Date   TRIG 217 (H) 02/05/2021   Lab Results  Component Value Date   CHOLHDL 4.9 02/05/2021   Lab Results  Component Value Date   HGBA1C 7.5 (H) 02/05/2021      Assessment & Plan:   Problem List Items Addressed This Visit       Cardiovascular and Mediastinum   HTN (hypertension)    Stable, doing well on Losartan 25, recheck at follow up.         Endocrine   Diabetes ([place])    Last A1c 7.5%, started on Metformin 500 BID and statin. Discussed etiology of disease and lifestyle changes, which she is working on. Follow up in 2 months for recheck A1c and microalbumin.         Other   HLD (hyperlipidemia)    Discussed lipid panel results, started on Crestor 10.        No orders of the defined types were placed in this encounter.   Follow-up: Return in about 2 months (around 05/03/2021).    Teodora Medici, DO

## 2021-03-05 NOTE — Assessment & Plan Note (Signed)
Discussed lipid panel results, started on Crestor 10.

## 2021-03-05 NOTE — Assessment & Plan Note (Signed)
Stable, doing well on Losartan 25, recheck at follow up.

## 2021-03-05 NOTE — Assessment & Plan Note (Signed)
Last A1c 7.5%, started on Metformin 500 BID and statin. Discussed etiology of disease and lifestyle changes, which she is working on. Follow up in 2 months for recheck A1c and microalbumin.

## 2021-03-05 NOTE — Patient Instructions (Signed)
It was great seeing you today!  Plan discussed at today's visit: -Continue current medications, continue to check blood pressure a few times a week -Continue to watch diet and continue lifestyle change   Follow up in: 2 months   Take care and let us know if you have any questions or concerns prior to your next visit.  Dr. Rosana Berger

## 2021-03-25 ENCOUNTER — Ambulatory Visit
Admission: RE | Admit: 2021-03-25 | Discharge: 2021-03-25 | Disposition: A | Discharge status: Home / Self Care | Payer: Medicare HMO | Source: Ambulatory Visit | Attending: Internal Medicine | Admitting: Internal Medicine

## 2021-03-25 DIAGNOSIS — Z1231 Encounter for screening mammogram for malignant neoplasm of breast: Secondary | ICD-10-CM | POA: Insufficient documentation

## 2021-05-06 ENCOUNTER — Encounter: Payer: Self-pay | Admitting: Internal Medicine

## 2021-05-06 ENCOUNTER — Ambulatory Visit (INDEPENDENT_AMBULATORY_CARE_PROVIDER_SITE_OTHER): Payer: Medicare HMO | Admitting: Internal Medicine

## 2021-05-06 VITALS — BP 122/78 | HR 97 | Temp 98.1°F | Resp 16 | Ht 60.0 in | Wt 166.4 lb

## 2021-05-06 DIAGNOSIS — R1084 Generalized abdominal pain: Secondary | ICD-10-CM

## 2021-05-06 DIAGNOSIS — E782 Mixed hyperlipidemia: Secondary | ICD-10-CM | POA: Diagnosis not present

## 2021-05-06 DIAGNOSIS — E1165 Type 2 diabetes mellitus with hyperglycemia: Secondary | ICD-10-CM | POA: Diagnosis not present

## 2021-05-06 DIAGNOSIS — I10 Essential (primary) hypertension: Secondary | ICD-10-CM

## 2021-05-06 DIAGNOSIS — Z9889 Other specified postprocedural states: Secondary | ICD-10-CM | POA: Diagnosis not present

## 2021-05-06 LAB — POCT URINALYSIS DIPSTICK
Bilirubin, UA: NEGATIVE
Blood, UA: NEGATIVE
Glucose, UA: NEGATIVE
Ketones, UA: NEGATIVE
Leukocytes, UA: NEGATIVE
Nitrite, UA: NEGATIVE
Odor: NORMAL
Protein, UA: NEGATIVE
Spec Grav, UA: 1.02 (ref 1.010–1.025)
Urobilinogen, UA: 0.2 E.U./dL
pH, UA: 5.5 (ref 5.0–8.0)

## 2021-05-06 MED ORDER — ROSUVASTATIN CALCIUM 20 MG PO TABS: 20.0000 mg | ORAL_TABLET | Freq: Every day | ORAL | 0 refills | Status: DC

## 2021-05-06 NOTE — Assessment & Plan Note (Signed)
Stable.  Blood pressure is at goal.  Continue losartan 25 mg daily and continue to check blood pressures at home. ?

## 2021-05-06 NOTE — Assessment & Plan Note (Signed)
Stable.  Continue Crestor 20 mg daily.  The patient does not want to be on medication long-term, however her ASCVD risk score is elevated at 17%.  We discussed that this increases her risk of heart attack and stroke and Crestor will help protect her from this in the future.  Continue to work on weight loss, lifestyle modification and increasing exercise.  Plan to recheck lipid panel at follow-up visit in 3 months, if numbers look better we can decrease dose of statin per patient's wishes.  Patient will be fasting at next visit. ?

## 2021-05-06 NOTE — Progress Notes (Signed)
? ?Established Patient Office Visit ? ?Subjective:  ?Patient ID: Krystal Boyd, female    DOB: [birth year]  Age: 66 y.o. MRN: [id number] ? ?CC:  ?Chief Complaint  ?Patient presents with  ? Diabetes  ? Hyperlipidemia  ? Hypertension  ? ? ?HPI ?Krystal Boyd presents for follow up on chronic medical conditions. Interpreter present to help with communication.  ? ?Hypertension: ?-Medications: Losartan 25 ?-Patient is compliant with above medications and reports no side effects. ?-Checking BP at home (average): Lower 130s to 120/80-90 ?-Denies any SOB, CP, vision changes, LE edema or symptoms of hypotension ?  ?Diabetes type 2: ?-Last A1c 7.5% 1/23 ?-Started Metformin 500 BID  ?-Patient is compliant with the above medications and reports no side effects but does not want to be on this medication long-term ?-Diet: made diet change to reduce sugars and carbs, was able to lose 7 pounds since last visit ?-Eye exam: UTD;  02/26/21 ?-Foot exam: UTD; 03/05/21 ?-Microalbumin: Due ?-Statin: yes ?-PNA vaccine: yes ?-Denies symptoms of hypoglycemia, polyuria, polydipsia, numbness extremities, foot ulcers/trauma.  ? ?HLD: ?-Medications: Crestor 20 ?-Patient is compliant with above medications and reports no side effects. ?-Last lipid panel: Lipid Panel  ?   ?Component Value Date/Time  ? CHOL 197 02/05/2021 1142  ? CHOL 181 03/16/2019 0855  ? TRIG 217 (H) 02/05/2021 1142  ? HDL 40 (L) 02/05/2021 1142  ? HDL 41 03/16/2019 0855  ? CHOLHDL 4.9 02/05/2021 1142  ? LDLCALC 123 (H) 02/05/2021 1142  ? LABVLDL 25 03/16/2019 0855  ? ? ?The 10-year ASCVD risk score (Arnett DK, et al., 2019) is: 17.1% ?  Values used to calculate the score: ?    Age: 41 years ?    Sex: Female ?    Is Non-Hispanic African American: No ?    Diabetic: Yes ?    Tobacco smoker: No ?    Systolic Blood Pressure: 952 mmHg ?    Is BP treated: Yes ?    HDL Cholesterol: 40 mg/dL ?    Total Cholesterol: 197 mg/dL ? ?Health Maintenance: ?-Blood work A1c, microalbumin due   ?-Mammogram 3/23; Birads-1 ?-Colon cancer screening - colonoscopy in [place] less than 10 years ago, uncertain when to repeat  ? ?Past Medical History:  ?Diagnosis Date  ? Hypertension   ? ? ?Past Surgical History:  ?Procedure Laterality Date  ? BLADDER REMOVAL    ? CHOLECYSTECTOMY    ? TUBAL LIGATION    ? ? ?Family History  ?Problem Relation Age of Onset  ? Diabetes Mother   ? Breast cancer Sister   ? ? ?Social History  ? ?Socioeconomic History  ? Marital status: Significant Other  ?  Spouse name: Not on file  ? Number of children: Not on file  ? Years of education: Not on file  ? Highest education level: Not on file  ?Occupational History  ? Not on file  ?Tobacco Use  ? Smoking status: Never  ? Smokeless tobacco: Never  ?Vaping Use  ? Vaping Use: Never used  ?Substance and Sexual Activity  ? Alcohol use: Yes  ?  Comment: occ  ? Drug use: Never  ? Sexual activity: Yes  ?Other Topics Concern  ? Not on file  ?Social History Narrative  ? Not on file  ? ?Social Determinants of Health  ? ?Financial Resource Strain: Not on file  ?Food Insecurity: Not on file  ?Transportation Needs: Not on file  ?Physical Activity: Not on file  ?Stress: Not on file  ?Social  Connections: Not on file  ?Intimate Partner Violence: Not on file  ? ? ?Outpatient Medications Prior to Visit  ?Medication Sig Dispense Refill  ? Blood Pressure Monitoring (ADULT BLOOD PRESSURE CUFF LG) KIT 1 each by Does not apply route daily. 1 kit 0  ? losartan (COZAAR) 25 MG tablet Take 1 tablet (25 mg total) by mouth daily. 90 tablet 3  ? metFORMIN (GLUCOPHAGE) 500 MG tablet Take 1 tablet (500 mg total) by mouth 2 (two) times daily with a meal. 180 tablet 3  ? rosuvastatin (CRESTOR) 20 MG tablet Take 1 tablet (20 mg total) by mouth daily. 90 tablet 3  ? ?No facility-administered medications prior to visit.  ? ? ?No Known Allergies ? ?ROS ?Review of Systems  ?Constitutional:  Negative for chills and fever.  ?Eyes:  Negative for visual disturbance.  ?Respiratory:   Negative for cough and shortness of breath.   ?Cardiovascular:  Negative for chest pain.  ?Gastrointestinal:  Positive for abdominal pain and constipation. Negative for diarrhea.  ?Genitourinary:  Negative for dysuria, frequency and hematuria.  ?Musculoskeletal:  Negative for arthralgias and myalgias.  ? ?  ?Objective:  ?  ?Physical Exam ?Constitutional:   ?   Appearance: Normal appearance. She is obese.  ?HENT:  ?   Head: Normocephalic and atraumatic.  ?Eyes:  ?   Conjunctiva/sclera: Conjunctivae normal.  ?Cardiovascular:  ?   Rate and Rhythm: Normal rate and regular rhythm.  ?Pulmonary:  ?   Effort: Pulmonary effort is normal.  ?   Breath sounds: Normal breath sounds.  ?Musculoskeletal:  ?   Right lower leg: No edema.  ?   Left lower leg: No edema.  ?Skin: ?   General: Skin is warm and dry.  ?Neurological:  ?   General: No focal deficit present.  ?   Mental Status: She is alert. Mental status is at baseline.  ?Psychiatric:     ?   Mood and Affect: Mood normal.     ?   Behavior: Behavior normal.  ? ? ?BP 122/78   Pulse 97   Temp 98.1 ?F (36.7 ?C)   Resp 16   Ht 5' (1.524 m)   Wt 166 lb 6.4 oz (75.5 kg)   SpO2 97%   BMI 32.50 kg/m?  ?Wt Readings from Last 3 Encounters:  ?05/06/21 166 lb 6.4 oz (75.5 kg)  ?03/05/21 173 lb (78.5 kg)  ?02/05/21 178 lb 8 oz (81 kg)  ? ? ? ?Health Maintenance Due  ?Topic Date Due  ? COVID-19 Vaccine (1) Never done  ? OPHTHALMOLOGY EXAM  Never done  ? ? ?There are no preventive care reminders to display for this patient. ? ?Lab Results  ?Component Value Date  ? TSH 2.500 03/16/2019  ? ?Lab Results  ?Component Value Date  ? WBC 7.6 02/05/2021  ? HGB 14.8 02/05/2021  ? HCT 45.6 (H) 02/05/2021  ? MCV 82.2 02/05/2021  ? PLT 199 02/05/2021  ? ?Lab Results  ?Component Value Date  ? NA 137 02/05/2021  ? K 4.7 02/05/2021  ? CO2 27 02/05/2021  ? GLUCOSE 143 (H) 02/05/2021  ? BUN 9 02/05/2021  ? CREATININE 0.76 02/05/2021  ? BILITOT 0.4 02/05/2021  ? ALKPHOS 96 03/16/2019  ? AST 15  02/05/2021  ? ALT 17 02/05/2021  ? PROT 7.6 02/05/2021  ? ALBUMIN 4.2 03/16/2019  ? CALCIUM 9.8 02/05/2021  ? EGFR 86 02/05/2021  ? ?Lab Results  ?Component Value Date  ? CHOL 197 02/05/2021  ? ?Lab  Results  ?Component Value Date  ? HDL 40 (L) 02/05/2021  ? ?Lab Results  ?Component Value Date  ? LDLCALC 123 (H) 02/05/2021  ? ?Lab Results  ?Component Value Date  ? TRIG 217 (H) 02/05/2021  ? ?Lab Results  ?Component Value Date  ? CHOLHDL 4.9 02/05/2021  ? ?Lab Results  ?Component Value Date  ? HGBA1C 7.5 (H) 02/05/2021  ? ? ?  ?Assessment & Plan:  ? ?Problem List Items Addressed This Visit   ? ?  ? Cardiovascular and Mediastinum  ? HTN (hypertension)  ?  Stable.  Blood pressure is at goal.  Continue losartan 25 mg daily and continue to check blood pressures at home. ? ?  ?  ? Relevant Medications  ? rosuvastatin (CRESTOR) 20 MG tablet  ?  ? Endocrine  ? Diabetes ([place]) - Primary  ?  Check A1c and microalbumin today.  Patient does not want to continue to be on metformin long-term, discussed that if her A1c is better today then we can decrease metformin to 500 mg once daily but if her numbers are the same or worse we will continue on her current regimen.  Patient voices understanding.  She was able to lose 7 pounds on her last office visit and efforts were congratulated.  She will continue to work on lifestyle management. ? ?  ?  ? Relevant Medications  ? rosuvastatin (CRESTOR) 20 MG tablet  ? Other Relevant Orders  ? HgB A1c  ? Urine Microalbumin w/creat. ratio  ?  ? Other  ? HLD (hyperlipidemia)  ?  Stable.  Continue Crestor 20 mg daily.  The patient does not want to be on medication long-term, however her ASCVD risk score is elevated at 17%.  We discussed that this increases her risk of heart attack and stroke and Crestor will help protect her from this in the future.  Continue to work on weight loss, lifestyle modification and increasing exercise.  Plan to recheck lipid panel at follow-up visit in 3 months, if  numbers look better we can decrease dose of statin per patient's wishes.  Patient will be fasting at next visit. ? ?  ?  ? Relevant Medications  ? rosuvastatin (CRESTOR) 20 MG tablet  ? ?Other Visit Diagnoses   ? ? Hyperlipidemia assoc

## 2021-05-06 NOTE — Patient Instructions (Addendum)
It was great seeing you today! ? ?Plan discussed at today's visit: ?-Blood work ordered today, results will be uploaded to MyChart. Will check urine today as well.  ?-Continue current medications for now ?-Referral to Urology for exam to recheck bladder ?-Continue to work on weight loss, diet and exercise. Plan to recheck lipids in the blood work next time, please be fasting at least 8-12 hours prior to next visit  ? ?Follow up in: 3 months  ? ?Take care and let us know if you have any questions or concerns prior to your next visit. ? ?Dr. Caralee Ates ? ?

## 2021-05-06 NOTE — Assessment & Plan Note (Signed)
Check A1c and microalbumin today.  Patient does not want to continue to be on metformin long-term, discussed that if her A1c is better today then we can decrease metformin to 500 mg once daily but if her numbers are the same or worse we will continue on her current regimen.  Patient voices understanding.  She was able to lose 7 pounds on her last office visit and efforts were congratulated.  She will continue to work on lifestyle management. ?

## 2021-05-07 LAB — HEMOGLOBIN A1C
Hgb A1c MFr Bld: 6.5 % of total Hgb — ABNORMAL HIGH (ref <5.7)
Mean Plasma Glucose: 140 mg/dL
eAG (mmol/L): 7.7 mmol/L

## 2021-05-07 LAB — MICROALBUMIN / CREATININE URINE RATIO
Creatinine, Urine: 136 mg/dL (ref 20–275)
Microalb Creat Ratio: 9 mcg/mg creat (ref <30)
Microalb, Ur: 1.2 mg/dL

## 2021-05-08 ENCOUNTER — Encounter: Payer: Self-pay | Admitting: *Deleted

## 2021-05-23 ENCOUNTER — Telehealth: Payer: Self-pay | Admitting: Internal Medicine

## 2021-05-23 NOTE — Telephone Encounter (Signed)
Copied from CRM [id number]. Topic: Medicare AWV ?>> May 23, 2021  1:01 PM Claudette Laws R wrote: ?Reason for CRM:  ? ?No answer unable to leave a message for patient to call back and schedule Medicare Annual Wellness Visit (AWV) in office.  ? ?If unable to come into the office for AWV,  please offer to do virtually or by telephone. ? ?No hx of AWV eligible for AWVI per [facility] as of 10/13/2019 ? ?Please schedule at anytime with [facility] Health Advisor.     ? ?45 minute appointment  ? ?Any questions, please call me at [phone] ?

## 2021-08-11 ENCOUNTER — Ambulatory Visit (INDEPENDENT_AMBULATORY_CARE_PROVIDER_SITE_OTHER): Payer: Medicare HMO | Admitting: Internal Medicine

## 2021-08-11 ENCOUNTER — Encounter: Payer: Self-pay | Admitting: Internal Medicine

## 2021-08-11 VITALS — BP 118/72 | HR 70 | Temp 98.1°F | Resp 16 | Ht 60.0 in | Wt 169.5 lb

## 2021-08-11 DIAGNOSIS — I10 Essential (primary) hypertension: Secondary | ICD-10-CM | POA: Diagnosis not present

## 2021-08-11 DIAGNOSIS — E559 Vitamin D deficiency, unspecified: Secondary | ICD-10-CM

## 2021-08-11 DIAGNOSIS — E782 Mixed hyperlipidemia: Secondary | ICD-10-CM | POA: Diagnosis not present

## 2021-08-11 DIAGNOSIS — L659 Nonscarring hair loss, unspecified: Secondary | ICD-10-CM

## 2021-08-11 DIAGNOSIS — E1165 Type 2 diabetes mellitus with hyperglycemia: Secondary | ICD-10-CM

## 2021-08-11 LAB — POCT GLYCOSYLATED HEMOGLOBIN (HGB A1C): Hemoglobin A1C: 7 % — AB (ref 4.0–5.6)

## 2021-08-11 MED ORDER — MULTI-VITAMIN/MINERALS PO TABS: 1.0000 | ORAL_TABLET | Freq: Every day | ORAL | 3 refills | Status: AC

## 2021-08-11 NOTE — Assessment & Plan Note (Signed)
A1c increased to 7.0% today after going down to Metformin 500 mg once daily. Discussed 2 options of going back to 500 mg BID or continuing once daily dosing and being strict with weight loss and diet. Patient states she was recently on vacation and ate more sweets and ice cream than normal. Patient elects to continue Metformin 500 mg once daily and will work more on lifestyle and diet. Plan to follow up in 3 months for recheck.

## 2021-08-11 NOTE — Assessment & Plan Note (Signed)
Blood pressure at goal here today - patient has not been taking Losartan 25 mg for the last 3 days. Blood pressure at home controlled. Discussed how ARBs are renally protective in the setting of diabetes and how her blood pressure might increase in more time without the medication. Plan to continue Losartan half tablet at 12.5 mg daily and continue to check blood pressures at home. Call if blood pressure starts to rise. Follow up in 3 months for recheck.

## 2021-08-11 NOTE — Progress Notes (Signed)
Established Patient Office Visit  Subjective:  Patient ID: Krystal Boyd, female    DOB: [birth year]  Age: 66 y.o. MRN: [id number]  CC:  Chief Complaint  Patient presents with   Follow-up   Diabetes   Hyperlipidemia   Hypertension    HPI Krystal Boyd presents for follow up on chronic medical conditions. Interpreter present to help with communication.   One acute compliant today - complaining of more hair loss over the last few months. More hair shedding after brushing, comes out all over head but is having generalized hair thinning. No patches of hair loss. Has always had minimal hair loss but now increased.   Hypertension: -Medications: Losartan 25 mg - hasn't been taking in the last 3 days because blood pressure has been good at home -Patient is generally compliant with above medications and reports no side effects. -Checking BP at home (average): highest 130, not going low  -Denies any SOB, CP, vision changes, LE edema or symptoms of hypotension.    Diabetes type 2: -Last A1c 6.5% 05/06/21 -Metformin 500 BID decreased to once daily  -Patient is compliant with the above medications and reports no side effects but does not want to be on this medication long-term -Diet: made diet change to reduce sugars and carbs, was able to lose 7 pounds last visit but up another 3 today -Eye exam: UTD;  02/26/21 - obtaining records  -Foot exam: UTD; 03/05/21 -Microalbumin: UTD 05/06/21 -Statin: yes -PNA vaccine: yes -Denies symptoms of hypoglycemia, polyuria, polydipsia, numbness extremities, foot ulcers/trauma.   HLD: -Medications: Crestor 20 - has been taking for 6 months but wants to recheck levels today to see if there has been any improvement. Stopped taking a few days ago -Patient is compliant with above medications and reports no side effects. -Last lipid panel: Lipid Panel     Component Value Date/Time   CHOL 197 02/05/2021 1142   CHOL 181 03/16/2019 0855   TRIG 217 (H)  02/05/2021 1142   HDL 40 (L) 02/05/2021 1142   HDL 41 03/16/2019 0855   CHOLHDL 4.9 02/05/2021 1142   LDLCALC 123 (H) 02/05/2021 1142   LABVLDL 25 03/16/2019 0855    The 10-year ASCVD risk score (Arnett DK, et al., 2019) is: 16.1%   Values used to calculate the score:     Age: 80 years     Sex: Female     Is Non-Hispanic African American: No     Diabetic: Yes     Tobacco smoker: No     Systolic Blood Pressure: 092 mmHg     Is BP treated: Yes     HDL Cholesterol: 40 mg/dL     Total Cholesterol: 197 mg/dL  Health Maintenance: -Blood work up to date  -Mammogram 3/23; Birads-1 -Colon cancer screening - colonoscopy in [place] less than 10 years ago, uncertain when to repeat   Past Medical History:  Diagnosis Date   Hypertension     Past Surgical History:  Procedure Laterality Date   BLADDER REMOVAL     CHOLECYSTECTOMY     TUBAL LIGATION      Family History  Problem Relation Age of Onset   Diabetes Mother    Breast cancer Sister     Social History   Socioeconomic History   Marital status: Significant Other    Spouse name: Not on file   Number of children: Not on file   Years of education: Not on file   Highest education level: Not on file  Occupational History   Not on file  Tobacco Use   Smoking status: Never   Smokeless tobacco: Never  Vaping Use   Vaping Use: Never used  Substance and Sexual Activity   Alcohol use: Yes    Comment: occ   Drug use: Never   Sexual activity: Yes  Other Topics Concern   Not on file  Social History Narrative   Not on file   Social Determinants of Health   Financial Resource Strain: Not on file  Food Insecurity: Not on file  Transportation Needs: Not on file  Physical Activity: Not on file  Stress: Not on file  Social Connections: Not on file  Intimate Partner Violence: Not on file    Outpatient Medications Prior to Visit  Medication Sig Dispense Refill   Blood Pressure Monitoring (ADULT BLOOD PRESSURE CUFF LG) KIT 1  each by Does not apply route daily. 1 kit 0   losartan (COZAAR) 25 MG tablet Take 1 tablet (25 mg total) by mouth daily. 90 tablet 3   metFORMIN (GLUCOPHAGE) 500 MG tablet Take 1 tablet (500 mg total) by mouth 2 (two) times daily with a meal. 180 tablet 3   rosuvastatin (CRESTOR) 20 MG tablet Take 1 tablet (20 mg total) by mouth daily. 90 tablet 0   No facility-administered medications prior to visit.    No Known Allergies  ROS Review of Systems  Constitutional:  Negative for chills and fever.  Eyes:  Negative for visual disturbance.  Respiratory:  Negative for shortness of breath.   Cardiovascular:  Negative for chest pain.  Gastrointestinal:  Negative for diarrhea.  Neurological:  Negative for dizziness, syncope and headaches.      Objective:    Physical Exam Constitutional:      Appearance: Normal appearance. She is obese.  HENT:     Head: Normocephalic and atraumatic.  Eyes:     Conjunctiva/sclera: Conjunctivae normal.  Cardiovascular:     Rate and Rhythm: Normal rate and regular rhythm.  Pulmonary:     Effort: Pulmonary effort is normal.     Breath sounds: Normal breath sounds.  Musculoskeletal:     Right lower leg: No edema.     Left lower leg: No edema.  Skin:    General: Skin is warm and dry.     Comments: Hair thinning throughout   Neurological:     General: No focal deficit present.     Mental Status: She is alert. Mental status is at baseline.  Psychiatric:        Mood and Affect: Mood normal.        Behavior: Behavior normal.     BP 118/72   Pulse 70   Temp 98.1 F (36.7 C)   Resp 16   Ht 5' (1.524 m)   Wt 169 lb 8 oz (76.9 kg)   SpO2 97%   BMI 33.10 kg/m  Wt Readings from Last 3 Encounters:  08/11/21 169 lb 8 oz (76.9 kg)  05/06/21 166 lb 6.4 oz (75.5 kg)  03/05/21 173 lb (78.5 kg)     Health Maintenance Due  Topic Date Due   COVID-19 Vaccine (1) Never done   OPHTHALMOLOGY EXAM  Never done   Zoster Vaccines- Shingrix (1 of 2) Never  done    There are no preventive care reminders to display for this patient.  Lab Results  Component Value Date   TSH 2.500 03/16/2019   Lab Results  Component Value Date   WBC 7.6 02/05/2021  HGB 14.8 02/05/2021   HCT 45.6 (H) 02/05/2021   MCV 82.2 02/05/2021   PLT 199 02/05/2021   Lab Results  Component Value Date   NA 137 02/05/2021   K 4.7 02/05/2021   CO2 27 02/05/2021   GLUCOSE 143 (H) 02/05/2021   BUN 9 02/05/2021   CREATININE 0.76 02/05/2021   BILITOT 0.4 02/05/2021   ALKPHOS 96 03/16/2019   AST 15 02/05/2021   ALT 17 02/05/2021   PROT 7.6 02/05/2021   ALBUMIN 4.2 03/16/2019   CALCIUM 9.8 02/05/2021   EGFR 86 02/05/2021   Lab Results  Component Value Date   CHOL 197 02/05/2021   Lab Results  Component Value Date   HDL 40 (L) 02/05/2021   Lab Results  Component Value Date   LDLCALC 123 (H) 02/05/2021   Lab Results  Component Value Date   TRIG 217 (H) 02/05/2021   Lab Results  Component Value Date   CHOLHDL 4.9 02/05/2021   Lab Results  Component Value Date   HGBA1C 6.5 (H) 05/06/2021      Assessment & Plan:   Problem List Items Addressed This Visit       Cardiovascular and Mediastinum   HTN (hypertension)    Blood pressure at goal here today - patient has not been taking Losartan 25 mg for the last 3 days. Blood pressure at home controlled. Discussed how ARBs are renally protective in the setting of diabetes and how her blood pressure might increase in more time without the medication. Plan to continue Losartan half tablet at 12.5 mg daily and continue to check blood pressures at home. Call if blood pressure starts to rise. Follow up in 3 months for recheck.         Endocrine   Diabetes ([place]) - Primary    A1c increased to 7.0% today after going down to Metformin 500 mg once daily. Discussed 2 options of going back to 500 mg BID or continuing once daily dosing and being strict with weight loss and diet. Patient states she was recently on  vacation and ate more sweets and ice cream than normal. Patient elects to continue Metformin 500 mg once daily and will work more on lifestyle and diet. Plan to follow up in 3 months for recheck.      Relevant Orders   POCT HgB A1C (Completed)     Other   HLD (hyperlipidemia)    Will recheck lipid panel today - has been on Crestor 20 mg daily for 6 months but recently stopped taking. Fasting today, will check lipid panel but discussed how this medication will protect her from a cardiovascular stand point and we will most likely continue the medication. Patient understands, denies side effects while on the medicine.       Relevant Orders   Lipid Profile   Other Visit Diagnoses     Hair loss    - Thinning throughout. Check TSH and Vitamin D levels today. Recommend multivitamin, Vitamin D supplements and biotin. If labs normal, can send to Dermatology for possible steroid injections.    Relevant Medications   Multiple Vitamins-Minerals (MULTIVITAMIN WITH MINERALS) tablet   Other Relevant Orders   TSH   Vitamin D (25 hydroxy)   Vitamin D deficiency       Relevant Orders   Vitamin D (25 hydroxy)       Follow-up: Return in about 3 months (around 11/11/2021).    Teodora Medici, DO

## 2021-08-11 NOTE — Assessment & Plan Note (Signed)
Will recheck lipid panel today - has been on Crestor 20 mg daily for 6 months but recently stopped taking. Fasting today, will check lipid panel but discussed how this medication will protect her from a cardiovascular stand point and we will most likely continue the medication. Patient understands, denies side effects while on the medicine.

## 2021-08-11 NOTE — Patient Instructions (Addendum)
It was great seeing you today!  Plan discussed at today's visit: -Blood work ordered today, results will be uploaded to MyChart.  -I will call you to discuss Crestor dose after blood work results -Start taking once a day women's multivitamin to help with  -Continue Losartan half a tablet  12.5 mg a day - keep checking blood pressure at home   Follow up in: 3 month   Take care and let us know if you have any questions or concerns prior to your next visit.  Dr. Caralee Ates

## 2021-08-12 LAB — LIPID PANEL
Cholesterol: 134 mg/dL (ref <200)
HDL: 41 mg/dL — ABNORMAL LOW (ref >=50)
LDL Cholesterol (Calc): 70 mg/dL (calc)
Non-HDL Cholesterol (Calc): 93 mg/dL (calc) (ref <130)
Total CHOL/HDL Ratio: 3.3 (calc) (ref <5.0)
Triglycerides: 151 mg/dL — ABNORMAL HIGH (ref <150)

## 2021-08-12 LAB — TSH: TSH: 3.78 mIU/L (ref 0.40–4.50)

## 2021-08-12 LAB — VITAMIN D 25 HYDROXY (VIT D DEFICIENCY, FRACTURES): Vit D, 25-Hydroxy: 36 ng/mL (ref 30–100)

## 2021-08-12 MED ORDER — ROSUVASTATIN CALCIUM 10 MG PO TABS: 10.0000 mg | ORAL_TABLET | Freq: Every day | ORAL | 3 refills | Status: DC

## 2021-08-12 NOTE — Addendum Note (Signed)
Addended by: Margarita Mail on: 08/12/2021 09:20 AM   Modules accepted: Orders

## 2021-08-15 ENCOUNTER — Encounter: Payer: Self-pay | Admitting: Internal Medicine

## 2021-11-08 ENCOUNTER — Other Ambulatory Visit: Payer: Self-pay | Admitting: Internal Medicine

## 2021-11-08 DIAGNOSIS — E782 Mixed hyperlipidemia: Secondary | ICD-10-CM

## 2021-11-10 NOTE — Telephone Encounter (Signed)
Requested Prescriptions  Pending Prescriptions Disp Refills  . rosuvastatin (CRESTOR) 20 MG tablet [Pharmacy Med Name: ROSUVASTATIN CALCIUM 20 MG TAB] 90 tablet 0    Sig: TAKE 1 TABLET BY MOUTH EVERY DAY     Cardiovascular:  Antilipid - Statins 2 Failed - 11/08/2021  8:37 AM      Failed - Lipid Panel in normal range within the last 12 months    Cholesterol, Total  Date Value Ref Range Status  03/16/2019 181 100 - 199 mg/dL Final   Cholesterol  Date Value Ref Range Status  08/11/2021 134 <200 mg/dL Final   LDL Cholesterol (Calc)  Date Value Ref Range Status  08/11/2021 70 mg/dL (calc) Final    Comment:    Reference range: <100 . Desirable range <100 mg/dL for primary prevention;   <70 mg/dL for patients with CHD or diabetic patients  with > or = 2 CHD risk factors. Marland Kitchen LDL-C is now calculated using the Martin-Hopkins  calculation, which is a validated novel method providing  better accuracy than the Friedewald equation in the  estimation of LDL-C.  Cresenciano Genre et al. Annamaria Helling. 6004;599(77): 2061-2068  (http://education.[facility].com/faq/FAQ164)    HDL  Date Value Ref Range Status  08/11/2021 41 (L) > OR = 50 mg/dL Final  03/16/2019 41 >39 mg/dL Final   Triglycerides  Date Value Ref Range Status  08/11/2021 151 (H) <150 mg/dL Final         Passed - Cr in normal range and within 360 days    Creat  Date Value Ref Range Status  02/05/2021 0.76 0.50 - 1.05 mg/dL Final   Creatinine, Urine  Date Value Ref Range Status  05/06/2021 136 20 - 275 mg/dL Final         Passed - Patient is not pregnant      Passed - Valid encounter within last 12 months    Recent Outpatient Visits          3 months ago Type 2 diabetes mellitus with hyperglycemia, without long-term current use of insulin [facility])   [facility] Teodora Medici, DO   6 months ago Type 2 diabetes mellitus with hyperglycemia, without long-term current use of insulin [facility])   [place], DO   8 months ago Hypertension, unspecified type   [facility] Teodora Medici, DO   9 months ago Type 2 diabetes mellitus with hyperglycemia, without long-term current use of insulin [facility])   [place], [place], DO      Future Appointments            Tomorrow Teodora Medici, [facility], [facility]

## 2021-11-11 ENCOUNTER — Ambulatory Visit (INDEPENDENT_AMBULATORY_CARE_PROVIDER_SITE_OTHER): Payer: Medicare HMO | Admitting: Internal Medicine

## 2021-11-11 ENCOUNTER — Encounter: Payer: Self-pay | Admitting: Internal Medicine

## 2021-11-11 VITALS — BP 138/86 | HR 71 | Temp 98.3°F | Resp 18 | Ht 60.0 in | Wt 174.1 lb

## 2021-11-11 DIAGNOSIS — I10 Essential (primary) hypertension: Secondary | ICD-10-CM

## 2021-11-11 DIAGNOSIS — E1165 Type 2 diabetes mellitus with hyperglycemia: Secondary | ICD-10-CM

## 2021-11-11 DIAGNOSIS — E782 Mixed hyperlipidemia: Secondary | ICD-10-CM | POA: Diagnosis not present

## 2021-11-11 LAB — POCT GLYCOSYLATED HEMOGLOBIN (HGB A1C): Hemoglobin A1C: 6.8 % — AB (ref 4.0–5.6)

## 2021-11-11 NOTE — Progress Notes (Signed)
Established Patient Office Visit  Subjective:  Patient ID: Krystal Boyd, female    DOB: [birth year]  Age: 66 y.o. MRN: [id number]  CC:  Chief Complaint  Patient presents with   Hypertension   Diabetes    HPI Krystal Boyd presents for follow up on chronic medical conditions. Interpreter present to help with communication.   Hypertension: -Medications: Losartan 12.5 mg daily  -Patient is generally compliant with above medications and reports no side effects. -Checking BP at home (average): 127/82 average  -Denies any SOB, CP, vision changes, LE edema or symptoms of hypotension.    Diabetes type 2: -Last A1c 6.5% 05/06/21, 6.8% today -Metformin 500 once daily  -Patient is compliant with the above medications and reports no side effects but does not want to be on this medication long-term -Diet: made diet change to reduce sugars and carbs, now exercising by walking and riding her bike -Eye exam: UTD;  02/26/21 - obtaining records  -Foot exam: UTD; 03/05/21 -Microalbumin: UTD 05/06/21 -Statin: yes -PNA vaccine: yes -Denies symptoms of hypoglycemia, polyuria, polydipsia, numbness extremities, foot ulcers/trauma.   HLD: -Medications: Crestor decreased to 10 mg at [facility] -Patient is compliant with above medications and reports no side effects. -Last lipid panel: Lipid Panel     Component Value Date/Time   CHOL 134 08/11/2021 0902   CHOL 181 03/16/2019 0855   TRIG 151 (H) 08/11/2021 0902   HDL 41 (L) 08/11/2021 0902   HDL 41 03/16/2019 0855   CHOLHDL 3.3 08/11/2021 0902   LDLCALC 70 08/11/2021 0902   LABVLDL 25 03/16/2019 0855    The 10-year ASCVD risk score (Arnett DK, et al., 2019) is: 20.2%   Values used to calculate the score:     Age: 30 years     Sex: Female     Is Non-Hispanic African American: No     Diabetic: Yes     Tobacco smoker: No     Systolic Blood Pressure: 814 mmHg     Is BP treated: Yes     HDL Cholesterol: 41 mg/dL     Total Cholesterol: 134  mg/dL  Health Maintenance: -Blood work up to date  -Mammogram 3/23; Birads-1 -Colon cancer screening - colonoscopy in [place] less than 10 years ago, uncertain when to repeat   Past Medical History:  Diagnosis Date   Hypertension     Past Surgical History:  Procedure Laterality Date   BLADDER REMOVAL     CHOLECYSTECTOMY     TUBAL LIGATION      Family History  Problem Relation Age of Onset   Diabetes Mother    Breast cancer Sister     Social History   Socioeconomic History   Marital status: Significant Other    Spouse name: Not on file   Number of children: Not on file   Years of education: Not on file   Highest education level: Not on file  Occupational History   Not on file  Tobacco Use   Smoking status: Never   Smokeless tobacco: Never  Vaping Use   Vaping Use: Never used  Substance and Sexual Activity   Alcohol use: Yes    Comment: occ   Drug use: Never   Sexual activity: Yes  Other Topics Concern   Not on file  Social History Narrative   Not on file   Social Determinants of Health   Financial Resource Strain: Not on file  Food Insecurity: Not on file  Transportation Needs: Not on file  Physical  Activity: Not on file  Stress: Not on file  Social Connections: Not on file  Intimate Partner Violence: Not on file    Outpatient Medications Prior to Visit  Medication Sig Dispense Refill   Blood Pressure Monitoring (ADULT BLOOD PRESSURE CUFF LG) KIT 1 each by Does not apply route daily. 1 kit 0   losartan (COZAAR) 25 MG tablet Take 1 tablet (25 mg total) by mouth daily. 90 tablet 3   metFORMIN (GLUCOPHAGE) 500 MG tablet Take 1 tablet (500 mg total) by mouth 2 (two) times daily with a meal. 180 tablet 3   Multiple Vitamins-Minerals (MULTIVITAMIN WITH MINERALS) tablet Take 1 tablet by mouth daily. 90 tablet 3   rosuvastatin (CRESTOR) 10 MG tablet Take 1 tablet (10 mg total) by mouth daily. 90 tablet 3   No facility-administered medications prior to visit.     No Known Allergies  ROS Review of Systems  Constitutional:  Negative for chills and fever.  Eyes:  Negative for visual disturbance.  Respiratory:  Negative for shortness of breath.   Cardiovascular:  Negative for chest pain.  Gastrointestinal:  Negative for diarrhea.  Neurological:  Negative for dizziness, syncope and headaches.      Objective:    Physical Exam Constitutional:      Appearance: Normal appearance.  HENT:     Head: Normocephalic and atraumatic.     Right Ear: Tympanic membrane, ear canal and external ear normal.     Left Ear: Tympanic membrane, ear canal and external ear normal.     Mouth/Throat:     Mouth: Mucous membranes are moist.     Pharynx: Oropharynx is clear.  Eyes:     Conjunctiva/sclera: Conjunctivae normal.  Cardiovascular:     Rate and Rhythm: Normal rate and regular rhythm.  Pulmonary:     Effort: Pulmonary effort is normal.     Breath sounds: Normal breath sounds.  Skin:    General: Skin is warm and dry.  Neurological:     General: No focal deficit present.     Mental Status: She is alert. Mental status is at baseline.  Psychiatric:        Mood and Affect: Mood normal.        Behavior: Behavior normal.     BP 138/86 (BP Location: Left Arm, Patient Position: Sitting, Cuff Size: Normal)   Pulse 71   Temp 98.3 F (36.8 C) (Oral)   Resp 18   Ht 5' (1.524 m)   Wt 174 lb 1.6 oz (79 kg)   SpO2 97%   BMI 34.00 kg/m  Wt Readings from Last 3 Encounters:  11/11/21 174 lb 1.6 oz (79 kg)  08/11/21 169 lb 8 oz (76.9 kg)  05/06/21 166 lb 6.4 oz (75.5 kg)     Health Maintenance Due  Topic Date Due   Medicare Annual Wellness (AWV)  Never done   COVID-19 Vaccine (1) Never done   Zoster Vaccines- Shingrix (1 of 2) Never done   INFLUENZA VACCINE  Never done    There are no preventive care reminders to display for this patient.  Lab Results  Component Value Date   TSH 3.78 08/11/2021   Lab Results  Component Value Date   WBC  7.6 02/05/2021   HGB 14.8 02/05/2021   HCT 45.6 (H) 02/05/2021   MCV 82.2 02/05/2021   PLT 199 02/05/2021   Lab Results  Component Value Date   NA 137 02/05/2021   K 4.7 02/05/2021   CO2 27  02/05/2021   GLUCOSE 143 (H) 02/05/2021   BUN 9 02/05/2021   CREATININE 0.76 02/05/2021   BILITOT 0.4 02/05/2021   ALKPHOS 96 03/16/2019   AST 15 02/05/2021   ALT 17 02/05/2021   PROT 7.6 02/05/2021   ALBUMIN 4.2 03/16/2019   CALCIUM 9.8 02/05/2021   EGFR 86 02/05/2021   Lab Results  Component Value Date   CHOL 134 08/11/2021   Lab Results  Component Value Date   HDL 41 (L) 08/11/2021   Lab Results  Component Value Date   LDLCALC 70 08/11/2021   Lab Results  Component Value Date   TRIG 151 (H) 08/11/2021   Lab Results  Component Value Date   CHOLHDL 3.3 08/11/2021   Lab Results  Component Value Date   HGBA1C 7.0 (A) 08/11/2021      Assessment & Plan:   1. Type 2 diabetes mellitus with hyperglycemia, without long-term current use of insulin (Rea): A1c here in the office stable at 6.8%.  Patient has been working hard on diet and exercise.  We will continue metformin 500 mg once a day and she will follow-up in 6 months for recheck.  - POCT HgB A1C  2. Primary hypertension: Chronic.  Blood pressure here today slightly borderline, however patient states she is getting lower numbers at home.  Continue losartan 12.5 mg daily.  3. Mixed hyperlipidemia: Doing well on Crestor 10 mg, we rechecked her cholesterol at her last office visit which had improved but her overall ASCVD risk score is about 20%. She will continue the statin daily.    Follow-up: Return in about 6 months (around 05/12/2022).    Teodora Medici, DO

## 2021-11-28 ENCOUNTER — Other Ambulatory Visit: Payer: Self-pay | Admitting: Internal Medicine

## 2021-11-28 DIAGNOSIS — E782 Mixed hyperlipidemia: Secondary | ICD-10-CM

## 2021-11-28 NOTE — Telephone Encounter (Signed)
Unable to refill per protocol, Rx expired. Medication was discontinued 08/12/21, dose change. Will refuse.  Requested Prescriptions  Pending Prescriptions Disp Refills   rosuvastatin (CRESTOR) 20 MG tablet [Pharmacy Med Name: ROSUVASTATIN CALCIUM 20 MG TAB] 90 tablet 0    Sig: TAKE 1 TABLET BY MOUTH EVERY DAY     Cardiovascular:  Antilipid - Statins 2 Failed - 11/28/2021  2:39 AM      Failed - Lipid Panel in normal range within the last 12 months    Cholesterol, Total  Date Value Ref Range Status  03/16/2019 181 100 - 199 mg/dL Final   Cholesterol  Date Value Ref Range Status  08/11/2021 134 <200 mg/dL Final   LDL Cholesterol (Calc)  Date Value Ref Range Status  08/11/2021 70 mg/dL (calc) Final    Comment:    Reference range: <100 . Desirable range <100 mg/dL for primary prevention;   <70 mg/dL for patients with CHD or diabetic patients  with > or = 2 CHD risk factors. Marland Kitchen LDL-C is now calculated using the Martin-Hopkins  calculation, which is a validated novel method providing  better accuracy than the Friedewald equation in the  estimation of LDL-C.  Horald Pollen et al. Lenox Ahr. 4098;119(14): 2061-2068  (http://education.[facility].com/faq/FAQ164)    HDL  Date Value Ref Range Status  08/11/2021 41 (L) > OR = 50 mg/dL Final  78/29/5621 41 >30 mg/dL Final   Triglycerides  Date Value Ref Range Status  08/11/2021 151 (H) <150 mg/dL Final         Passed - Cr in normal range and within 360 days    Creat  Date Value Ref Range Status  02/05/2021 0.76 0.50 - 1.05 mg/dL Final   Creatinine, Urine  Date Value Ref Range Status  05/06/2021 136 20 - 275 mg/dL Final         Passed - Patient is not pregnant      Passed - Valid encounter within last 12 months    Recent Outpatient Visits           2 weeks ago Type 2 diabetes mellitus with hyperglycemia, without long-term current use of insulin [facility])   [facility] Margarita Mail, DO   3 months ago  Type 2 diabetes mellitus with hyperglycemia, without long-term current use of insulin [facility])   [facility] Margarita Mail, DO   6 months ago Type 2 diabetes mellitus with hyperglycemia, without long-term current use of insulin [facility])   [facility] Margarita Mail, DO   8 months ago Hypertension, unspecified type   [facility] Margarita Mail, DO   9 months ago Type 2 diabetes mellitus with hyperglycemia, without long-term current use of insulin [facility])   [facility] Margarita Mail, DO       Future Appointments             In 5 months Margarita Mail, DO [facility]

## 2021-12-12 ENCOUNTER — Other Ambulatory Visit: Payer: Self-pay | Admitting: Internal Medicine

## 2021-12-12 DIAGNOSIS — E782 Mixed hyperlipidemia: Secondary | ICD-10-CM

## 2021-12-12 NOTE — Telephone Encounter (Signed)
Dose changed 08/12/21. Requested Prescriptions  Refused Prescriptions Disp Refills   rosuvastatin (CRESTOR) 20 MG tablet [Pharmacy Med Name: ROSUVASTATIN CALCIUM 20 MG TAB] 90 tablet 0    Sig: TAKE 1 TABLET BY MOUTH EVERY DAY     Cardiovascular:  Antilipid - Statins 2 Failed - 12/12/2021  1:37 AM      Failed - Lipid Panel in normal range within the last 12 months    Cholesterol, Total  Date Value Ref Range Status  03/16/2019 181 100 - 199 mg/dL Final   Cholesterol  Date Value Ref Range Status  08/11/2021 134 <200 mg/dL Final   LDL Cholesterol (Calc)  Date Value Ref Range Status  08/11/2021 70 mg/dL (calc) Final    Comment:    Reference range: <100 . Desirable range <100 mg/dL for primary prevention;   <70 mg/dL for patients with CHD or diabetic patients  with > or = 2 CHD risk factors. Marland Kitchen LDL-C is now calculated using the Martin-Hopkins  calculation, which is a validated novel method providing  better accuracy than the Friedewald equation in the  estimation of LDL-C.  Horald Pollen et al. Lenox Ahr. 6962;952(84): 2061-2068  (http://education.[facility].com/faq/FAQ164)    HDL  Date Value Ref Range Status  08/11/2021 41 (L) > OR = 50 mg/dL Final  13/24/4010 41 >27 mg/dL Final   Triglycerides  Date Value Ref Range Status  08/11/2021 151 (H) <150 mg/dL Final         Passed - Cr in normal range and within 360 days    Creat  Date Value Ref Range Status  02/05/2021 0.76 0.50 - 1.05 mg/dL Final   Creatinine, Urine  Date Value Ref Range Status  05/06/2021 136 20 - 275 mg/dL Final         Passed - Patient is not pregnant      Passed - Valid encounter within last 12 months    Recent Outpatient Visits           1 month ago Type 2 diabetes mellitus with hyperglycemia, without long-term current use of insulin [facility])   [facility] Margarita Mail, DO   4 months ago Type 2 diabetes mellitus with hyperglycemia, without long-term current use of insulin  [facility])   [facility] Margarita Mail, DO   7 months ago Type 2 diabetes mellitus with hyperglycemia, without long-term current use of insulin [facility])   [facility] [place], Gentry Fitz, DO   9 months ago Hypertension, unspecified type   [facility] Margarita Mail, DO   10 months ago Type 2 diabetes mellitus with hyperglycemia, without long-term current use of insulin [facility])   [facility] Margarita Mail, DO       Future Appointments             In 5 months Margarita Mail, DO [facility]

## 2022-01-22 DIAGNOSIS — R030 Elevated blood-pressure reading, without diagnosis of hypertension: Secondary | ICD-10-CM | POA: Diagnosis not present

## 2022-01-22 DIAGNOSIS — R6889 Other general symptoms and signs: Secondary | ICD-10-CM | POA: Diagnosis not present

## 2022-01-22 DIAGNOSIS — U071 COVID-19: Secondary | ICD-10-CM | POA: Diagnosis not present

## 2022-02-08 ENCOUNTER — Other Ambulatory Visit: Payer: Self-pay | Admitting: Internal Medicine

## 2022-02-08 DIAGNOSIS — I10 Essential (primary) hypertension: Secondary | ICD-10-CM

## 2022-02-08 DIAGNOSIS — E1165 Type 2 diabetes mellitus with hyperglycemia: Secondary | ICD-10-CM

## 2022-02-09 NOTE — Telephone Encounter (Signed)
Requested medication (s) are due for refill today - expired Rx  Requested medication (s) are on the active medication list -yes  Future visit scheduled -yes  Last refill: 02/05/21 #90 3RF  Notes to clinic: Per last 2 OV- patient taking half dose Losartan at 12.5 mg- directions need update, Rx expired- both  Requested Prescriptions  Pending Prescriptions Disp Refills   losartan (COZAAR) 25 MG tablet [Pharmacy Med Name: LOSARTAN POTASSIUM 25 MG TAB] 90 tablet 3    Sig: Take 1 tablet (25 mg total) by mouth daily.     Cardiovascular:  Angiotensin Receptor Blockers Failed - 02/08/2022  8:18 AM      Failed - Cr in normal range and within 180 days    Creat  Date Value Ref Range Status  02/05/2021 0.76 0.50 - 1.05 mg/dL Final   Creatinine, Urine  Date Value Ref Range Status  05/06/2021 136 20 - 275 mg/dL Final         Failed - K in normal range and within 180 days    Potassium  Date Value Ref Range Status  02/05/2021 4.7 3.5 - 5.3 mmol/L Final         Passed - Patient is not pregnant      Passed - Last BP in normal range    BP Readings from Last 1 Encounters:  11/11/21 138/86         Passed - Valid encounter within last 6 months    Recent Outpatient Visits           3 months ago Type 2 diabetes mellitus with hyperglycemia, without long-term current use of insulin [facility])   [facility] Teodora Medici, DO   6 months ago Type 2 diabetes mellitus with hyperglycemia, without long-term current use of insulin [facility])   [facility] Teodora Medici, DO   9 months ago Type 2 diabetes mellitus with hyperglycemia, without long-term current use of insulin [facility])   [place], DO   11 months ago Hypertension, unspecified type   [facility] Teodora Medici, DO   1 year ago Type 2 diabetes mellitus with hyperglycemia, without long-term current use of insulin  [facility])   [facility] Teodora Medici, DO       Future Appointments             In 3 months Teodora Medici, [facility]             metFORMIN (GLUCOPHAGE) 500 MG tablet [Pharmacy Med Name: METFORMIN HCL 500 MG TABLET] 180 tablet 3    Sig: TAKE 1 TABLET BY MOUTH 2 TIMES DAILY WITH A MEAL.     Endocrinology:  Diabetes - Biguanides Failed - 02/08/2022  8:18 AM      Failed - Cr in normal range and within 360 days    Creat  Date Value Ref Range Status  02/05/2021 0.76 0.50 - 1.05 mg/dL Final   Creatinine, Urine  Date Value Ref Range Status  05/06/2021 136 20 - 275 mg/dL Final         Failed - eGFR in normal range and within 360 days    GFR calc Af Amer  Date Value Ref Range Status  03/16/2019 108 >59 mL/min/1.73 Final   GFR calc non Af Amer  Date Value Ref Range Status  03/16/2019 93 >59 mL/min/1.73 Final   eGFR  Date Value Ref Range  Status  02/05/2021 86 > OR = 60 mL/min/1.67m2 Final    Comment:    The eGFR is based on the CKD-EPI 2021 equation. To calculate  the new eGFR from a previous Creatinine or Cystatin C result, go to https://www.kidney.org/professionals/ kdoqi/gfr%5Fcalculator          Failed - B12 Level in normal range and within 720 days    Vitamin B-12  Date Value Ref Range Status  03/16/2019 647 232 - 1,245 pg/mL Final         Failed - CBC within normal limits and completed in the last 12 months    WBC  Date Value Ref Range Status  02/05/2021 7.6 3.8 - 10.8 Thousand/uL Final   RBC  Date Value Ref Range Status  02/05/2021 5.55 (H) 3.80 - 5.10 Million/uL Final   Hemoglobin  Date Value Ref Range Status  02/05/2021 14.8 11.7 - 15.5 g/dL Final  03/16/2019 14.3 11.1 - 15.9 g/dL Final   HCT  Date Value Ref Range Status  02/05/2021 45.6 (H) 35.0 - 45.0 % Final   Hematocrit  Date Value Ref Range Status  03/16/2019 43.5 34.0 - 46.6 % Final   MCHC  Date Value Ref Range Status   02/05/2021 32.5 32.0 - 36.0 g/dL Final   [facility]  Date Value Ref Range Status  02/05/2021 26.7 (L) 27.0 - 33.0 pg Final   MCV  Date Value Ref Range Status  02/05/2021 82.2 80.0 - 100.0 fL Final  03/16/2019 82 79 - 97 fL Final   No results found for: "PLTCOUNTKUC", "LABPLAT", "POCPLA" RDW  Date Value Ref Range Status  02/05/2021 13.2 11.0 - 15.0 % Final  03/16/2019 13.5 11.7 - 15.4 % Final         Passed - HBA1C is between 0 and 7.9 and within 180 days    Hemoglobin A1C  Date Value Ref Range Status  11/11/2021 6.8 (A) 4.0 - 5.6 % Final   Hgb A1c MFr Bld  Date Value Ref Range Status  05/06/2021 6.5 (H) <5.7 % of total Hgb Final    Comment:    For someone without known diabetes, a hemoglobin A1c value of 6.5% or greater indicates that they may have  diabetes and this should be confirmed with a follow-up  test. . For someone with known diabetes, a value <7% indicates  that their diabetes is well controlled and a value  greater than or equal to 7% indicates suboptimal  control. A1c targets should be individualized based on  duration of diabetes, age, comorbid conditions, and  other considerations. . Currently, no consensus exists regarding use of hemoglobin A1c for diagnosis of diabetes for children. Renella Cunas - Valid encounter within last 6 months    Recent Outpatient Visits           3 months ago Type 2 diabetes mellitus with hyperglycemia, without long-term current use of insulin [facility])   [facility] Te[facility]ora Medic[facility], DO   6 months ago Type 2 diabetes mellitus with hyperglycemia, without long-term current use of insulin [facility])   [facility] Teod[facility] Medic[facility], DO   9 months ago Type 2 diabetes mellitus with hyperglycemia, without long-term current use of insulin [facility])   [place], DO   11 months ago Hypertension, unspecified type   [place], DO   1 year ago Type 2 diabetes  mellitus with hyperglycemia, without long-term current use of insulin [facility])   [place] [facility] Margarita Mail, DO       Future Appointments             In 3 months Margarita Mail, DO [place] [facility], [facility]               Requested Prescriptions  Pending Prescriptions Disp Refills   losartan (COZAAR) 25 MG tablet [Pharmacy Med Name: LOSARTAN POTASSIUM 25 MG TAB] 90 tablet 3    Sig: Take 1 tablet (25 mg total) by mouth daily.     Cardiovascular:  Angiotensin Receptor Blockers Failed - 02/08/2022  8:18 AM      Failed - Cr in normal range and within 180 days    Creat  Date Value Ref Range Status  02/05/2021 0.76 0.50 - 1.05 mg/dL Final   Creatinine, Urine  Date Value Ref Range Status  05/06/2021 136 20 - 275 mg/dL Final         Failed - K in normal range and within 180 days    Potassium  Date Value Ref Range Status  02/05/2021 4.7 3.5 - 5.3 mmol/L Final         Passed - Patient is not pregnant      Passed - Last BP in normal range    BP Readings from Last 1 Encounters:  11/11/21 138/86         Passed - Valid encounter within last 6 months    Recent Outpatient Visits           3 months ago Type 2 diabetes mellitus with hyperglycemia, without long-term current use of insulin [facility])   [facility] Margarita Mail, DO   6 months ago Type 2 diabetes mellitus with hyperglycemia, without long-term current use of insulin [facility])   [place] [facility] Margarita Mail, DO   9 months ago Type 2 diabetes mellitus with hyperglycemia, without long-term current use of insulin [facility])   [facility] Margarita Mail, DO   11 months ago Hypertension, unspecified type   [facility] Margarita Mail, DO   1 year ago Type 2 diabetes mellitus with hyperglycemia,  without long-term current use of insulin [facility])   [place] [facility] Margarita Mail, DO       Future Appointments             In 3 months Margarita Mail, DO  [facility]             metFORMIN (GLUCOPHAGE) 500 MG tablet [Pharmacy Med Name: METFORMIN HCL 500 MG TABLET] 180 tablet 3    Sig: TAKE 1 TABLET BY MOUTH 2 TIMES DAILY WITH A MEAL.     Endocrinology:  Diabetes - Biguanides Failed - 02/08/2022  8:18 AM      Failed - Cr in normal range and within 360 days    Creat  Date Value Ref Range Status  02/05/2021 0.76 0.50 - 1.05 mg/dL Final   Creatinine, Urine  Date Value Ref Range Status  05/06/2021 136 20 - 275 mg/dL Final         Failed - eGFR in normal range and within 360 days    GFR calc Af Amer  Date Value Ref Range Status  03/16/2019 108 >59 mL/min/1.73 Final   GFR calc non Af Amer  Date Value Ref Range Status  03/16/2019 93 >59  mL/min/1.73 Final   eGFR  Date Value Ref Range Status  02/05/2021 86 > OR = 60 mL/min/1.67m2 Final    Comment:    The eGFR is based on the CKD-EPI 2021 equation. To calculate  the new eGFR from a previous Creatinine or Cystatin C result, go to https://www.kidney.org/professionals/ kdoqi/gfr%5Fcalculator          Failed - B12 Level in normal range and within 720 days    Vitamin B-12  Date Value Ref Range Status  03/16/2019 647 232 - 1,245 pg/mL Final         Failed - CBC within normal limits and completed in the last 12 months    WBC  Date Value Ref Range Status  02/05/2021 7.6 3.8 - 10.8 Thousand/uL Final   RBC  Date Value Ref Range Status  02/05/2021 5.55 (H) 3.80 - 5.10 Million/uL Final   Hemoglobin  Date Value Ref Range Status  02/05/2021 14.8 11.7 - 15.5 g/dL Final  94/70/9628 36.6 11.1 - 15.9 g/dL Final   HCT  Date Value Ref Range Status  02/05/2021 45.6 (H) 35.0 - 45.0 % Final   Hematocrit  Date Value Ref Range Status  03/16/2019 43.5 34.0 - 46.6 %  Final   MCHC  Date Value Ref Range Status  02/05/2021 32.5 32.0 - 36.0 g/dL Final   [facility]  Date Value Ref Range Status  02/05/2021 26.7 (L) 27.0 - 33.0 pg Final   MCV  Date Value Ref Range Status  02/05/2021 82.2 80.0 - 100.0 fL Final  03/16/2019 82 79 - 97 fL Final   No results found for: "PLTCOUNTKUC", "LABPLAT", "POCPLA" RDW  Date Value Ref Range Status  02/05/2021 13.2 11.0 - 15.0 % Final  03/16/2019 13.5 11.7 - 15.4 % Final         Passed - HBA1C is between 0 and 7.9 and within 180 days    Hemoglobin A1C  Date Value Ref Range Status  11/11/2021 6.8 (A) 4.0 - 5.6 % Final   Hgb A1c MFr Bld  Date Value Ref Range Status  05/06/2021 6.5 (H) <5.7 % of total Hgb Final    Comment:    For someone without known diabetes, a hemoglobin A1c value of 6.5% or greater indicates that they may have  diabetes and this should be confirmed with a follow-up  test. . For someone with known diabetes, a value <7% indicates  that their diabetes is well controlled and a value  greater than or equal to 7% indicates suboptimal  control. A1c targets should be individualized based on  duration of diabetes, age, comorbid conditions, and  other considerations. . Currently, no consensus exists regarding use of hemoglobin A1c for diagnosis of diabetes for children. Verna Czech - Valid encounter within last 6 months    Recent Outpatient Visits           3 months ago Type 2 diabetes mellitus with hyperglycemia, without long-term current use of insulin [facility])   [place] [facility] Margarita Mail, DO   6 months ago Type 2 diabetes mellitus with hyperglycemia, without long-term current use of insulin [facility])   [place] [facility] Margarita Mail, DO   9 months ago Type 2 diabetes mellitus with hyperglycemia, without long-term current use of insulin [facility])   [facility] Margarita Mail, DO   11 months ago Hypertension,  unspecified type   [facility],  [place], DO   1 year ago Type 2 diabetes mellitus with hyperglycemia, without long-term current use of insulin [facility])   [place] [facility] Margarita Mail, DO       Future Appointments             In 3 months Margarita Mail, DO [place] [facility]

## 2022-05-10 NOTE — Progress Notes (Signed)
Established Patient Office Visit  Subjective:  Patient ID: Krystal Boyd, female    DOB: 11/27/1955  Age: 67 y.o. MRN: [id number]  CC:  No chief complaint on file.   HPI Krystal Boyd presents for follow up on chronic medical conditions. Interpreter present to help with communication.   Hypertension: -Medications: Losartan 12.5 mg daily  -Patient is generally compliant with above medications and reports no side effects. -Checking BP at home (average): 127/82 average  -Denies any SOB, CP, vision changes, LE edema or symptoms of hypotension.    Diabetes type 2: -Last A1c 10/23 6.8% -Metformin 500 once daily  -Patient is compliant with the above medications and reports no side effects but does not want to be on this medication long-term -Diet: made diet change to reduce sugars and carbs, now exercising by walking and riding her bike -Eye exam: UTD;  02/26/21 - obtaining records  -Foot exam: UTD; 03/05/21 -Microalbumin: UTD 05/06/21 -Statin: yes -PNA vaccine: yes -Denies symptoms of hypoglycemia, polyuria, polydipsia, numbness extremities, foot ulcers/trauma.   HLD: -Medications: Crestor 10 mg -Patient is compliant with above medications and reports no side effects. -Last lipid panel: Lipid Panel     Component Value Date/Time   CHOL 134 08/11/2021 0902   CHOL 181 03/16/2019 0855   TRIG 151 (H) 08/11/2021 0902   HDL 41 (L) 08/11/2021 0902   HDL 41 03/16/2019 0855   CHOLHDL 3.3 08/11/2021 0902   LDLCALC 70 08/11/2021 0902   LABVLDL 25 03/16/2019 0855    The 10-year ASCVD risk score (Arnett DK, et al., 2019) is: 20.2%   Values used to calculate the score:     Age: 28 years     Sex: Female     Is Non-Hispanic African American: No     Diabetic: Yes     Tobacco smoker: No     Systolic Blood Pressure: 138 mmHg     Is BP treated: Yes     HDL Cholesterol: 41 mg/dL     Total Cholesterol: 134 mg/dL  Health Maintenance: -Blood work up to date  -Mammogram 3/23;  Birads-1 -Colon cancer screening - colonoscopy in [place] less than 10 years ago, uncertain when to repeat   Past Medical History:  Diagnosis Date   Hypertension     Past Surgical History:  Procedure Laterality Date   BLADDER REMOVAL     CHOLECYSTECTOMY     TUBAL LIGATION      Family History  Problem Relation Age of Onset   Diabetes Mother    Breast cancer Sister     Social History   Socioeconomic History   Marital status: Significant Other    Spouse name: Not on file   Number of children: Not on file   Years of education: Not on file   Highest education level: Not on file  Occupational History   Not on file  Tobacco Use   Smoking status: Never   Smokeless tobacco: Never  Vaping Use   Vaping Use: Never used  Substance and Sexual Activity   Alcohol use: Yes    Comment: occ   Drug use: Never   Sexual activity: Yes  Other Topics Concern   Not on file  Social History Narrative   Not on file   Social Determinants of Health   Financial Resource Strain: Not on file  Food Insecurity: Not on file  Transportation Needs: Not on file  Physical Activity: Not on file  Stress: Not on file  Social Connections: Not on  file  Intimate Partner Violence: Not on file    Outpatient Medications Prior to Visit  Medication Sig Dispense Refill   Blood Pressure Monitoring (ADULT BLOOD PRESSURE CUFF LG) KIT 1 each by Does not apply route daily. 1 kit 0   losartan (COZAAR) 25 MG tablet TAKE 1 TABLET (25 MG TOTAL) BY MOUTH DAILY. 90 tablet 1   metFORMIN (GLUCOPHAGE) 500 MG tablet TAKE 1 TABLET BY MOUTH 2 TIMES DAILY WITH A MEAL. 180 tablet 1   Multiple Vitamins-Minerals (MULTIVITAMIN WITH MINERALS) tablet Take 1 tablet by mouth daily. 90 tablet 3   rosuvastatin (CRESTOR) 10 MG tablet Take 1 tablet (10 mg total) by mouth daily. 90 tablet 3   No facility-administered medications prior to visit.    No Known Allergies  ROS Review of Systems  Constitutional:  Negative for chills and  fever.  Eyes:  Negative for visual disturbance.  Respiratory:  Negative for shortness of breath.   Cardiovascular:  Negative for chest pain.  Gastrointestinal:  Negative for diarrhea.  Neurological:  Negative for dizziness, syncope and headaches.      Objective:    Physical Exam Constitutional:      Appearance: Normal appearance.  HENT:     Head: Normocephalic and atraumatic.     Right Ear: Tympanic membrane, ear canal and external ear normal.     Left Ear: Tympanic membrane, ear canal and external ear normal.     Mouth/Throat:     Mouth: Mucous membranes are moist.     Pharynx: Oropharynx is clear.  Eyes:     Conjunctiva/sclera: Conjunctivae normal.  Cardiovascular:     Rate and Rhythm: Normal rate and regular rhythm.  Pulmonary:     Effort: Pulmonary effort is normal.     Breath sounds: Normal breath sounds.  Skin:    General: Skin is warm and dry.  Neurological:     General: No focal deficit present.     Mental Status: She is alert. Mental status is at baseline.  Psychiatric:        Mood and Affect: Mood normal.        Behavior: Behavior normal.     There were no vitals taken for this visit. Wt Readings from Last 3 Encounters:  11/11/21 174 lb 1.6 oz (79 kg)  08/11/21 169 lb 8 oz (76.9 kg)  05/06/21 166 lb 6.4 oz (75.5 kg)     Health Maintenance Due  Topic Date Due   Medicare Annual Wellness (AWV)  Never done   COVID-19 Vaccine (1) Never done   DTaP/Tdap/Td (1 - Tdap) Never done   Zoster Vaccines- Shingrix (1 of 2) Never done   Diabetic kidney evaluation - eGFR measurement  02/05/2022   OPHTHALMOLOGY EXAM  02/26/2022   FOOT EXAM  03/05/2022   Diabetic kidney evaluation - Urine ACR  05/07/2022    There are no preventive care reminders to display for this patient.  Lab Results  Component Value Date   TSH 3.78 08/11/2021   Lab Results  Component Value Date   WBC 7.6 02/05/2021   HGB 14.8 02/05/2021   HCT 45.6 (H) 02/05/2021   MCV 82.2 02/05/2021    PLT 199 02/05/2021   Lab Results  Component Value Date   NA 137 02/05/2021   K 4.7 02/05/2021   CO2 27 02/05/2021   GLUCOSE 143 (H) 02/05/2021   BUN 9 02/05/2021   CREATININE 0.76 02/05/2021   BILITOT 0.4 02/05/2021   ALKPHOS 96 03/16/2019   AST  15 02/05/2021   ALT 17 02/05/2021   PROT 7.6 02/05/2021   ALBUMIN 4.2 03/16/2019   CALCIUM 9.8 02/05/2021   EGFR 86 02/05/2021   Lab Results  Component Value Date   CHOL 134 08/11/2021   Lab Results  Component Value Date   HDL 41 (L) 08/11/2021   Lab Results  Component Value Date   LDLCALC 70 08/11/2021   Lab Results  Component Value Date   TRIG 151 (H) 08/11/2021   Lab Results  Component Value Date   CHOLHDL 3.3 08/11/2021   Lab Results  Component Value Date   HGBA1C 6.8 (A) 11/11/2021      Assessment & Plan:   1. Type 2 diabetes mellitus with hyperglycemia, without long-term current use of insulin (HCC): A1c here in the office stable at 6.8%.  Patient has been working hard on diet and exercise.  We will continue metformin 500 mg once a day and she will follow-up in 6 months for recheck.  - POCT HgB A1C  2. Primary hypertension: Chronic.  Blood pressure here today slightly borderline, however patient states she is getting lower numbers at home.  Continue losartan 12.5 mg daily.  3. Mixed hyperlipidemia: Doing well on Crestor 10 mg, we rechecked her cholesterol at her last office visit which had improved but her overall ASCVD risk score is about 20%. She will continue the statin daily.    Follow-up: No follow-ups on file.    Margarita Mail, DO

## 2022-05-11 ENCOUNTER — Encounter: Payer: Self-pay | Admitting: Internal Medicine

## 2022-05-11 ENCOUNTER — Ambulatory Visit (INDEPENDENT_AMBULATORY_CARE_PROVIDER_SITE_OTHER): Payer: Medicare HMO | Admitting: Internal Medicine

## 2022-05-11 VITALS — BP 138/84 | HR 79 | Temp 97.6°F | Resp 16 | Ht 60.0 in | Wt 177.1 lb

## 2022-05-11 DIAGNOSIS — I10 Essential (primary) hypertension: Secondary | ICD-10-CM | POA: Diagnosis not present

## 2022-05-11 DIAGNOSIS — E782 Mixed hyperlipidemia: Secondary | ICD-10-CM | POA: Diagnosis not present

## 2022-05-11 DIAGNOSIS — Z1231 Encounter for screening mammogram for malignant neoplasm of breast: Secondary | ICD-10-CM | POA: Diagnosis not present

## 2022-05-11 DIAGNOSIS — M1990 Unspecified osteoarthritis, unspecified site: Secondary | ICD-10-CM | POA: Diagnosis not present

## 2022-05-11 DIAGNOSIS — E1165 Type 2 diabetes mellitus with hyperglycemia: Secondary | ICD-10-CM | POA: Diagnosis not present

## 2022-05-11 DIAGNOSIS — K5900 Constipation, unspecified: Secondary | ICD-10-CM

## 2022-05-11 DIAGNOSIS — N952 Postmenopausal atrophic vaginitis: Secondary | ICD-10-CM

## 2022-05-11 MED ORDER — ESTROGENS CONJUGATED 0.625 MG/GM VA CREA
1.0000 | TOPICAL_CREAM | Freq: Every day | VAGINAL | 12 refills | Status: DC | PRN
Start: 2022-05-11 — End: 2022-08-11

## 2022-05-11 NOTE — Patient Instructions (Addendum)
It was great seeing you today!  Plan discussed at today's visit: -Blood work ordered today, results will be uploaded to MyChart.  -Recommend Aleve as needed for arthritis  -Use estrogen vaginal cream and a water base lubricant for sexual activity  -Recommend fiber supplements like Benefiber and Metamucil and 1 capful of Miralax a day  -Remember to find out the location of colonoscopy   Follow up in: 3 months   Take care and let us know if you have any questions or concerns prior to your next visit.  Dr. Caralee Ates

## 2022-05-12 LAB — CBC WITH DIFFERENTIAL/PLATELET
Absolute Monocytes: 447 cells/uL (ref 200–950)
Basophils Absolute: 50 cells/uL (ref 0–200)
Basophils Relative: 0.7 %
Eosinophils Absolute: 192 cells/uL (ref 15–500)
Eosinophils Relative: 2.7 %
HCT: 43.8 % (ref 35.0–45.0)
Hemoglobin: 14.1 g/dL (ref 11.7–15.5)
Lymphs Abs: 2783 cells/uL (ref 850–3900)
MCH: 26.8 pg — ABNORMAL LOW (ref 27.0–33.0)
MCHC: 32.2 g/dL (ref 32.0–36.0)
MCV: 83.3 fL (ref 80.0–100.0)
MPV: 11.2 fL (ref 7.5–12.5)
Monocytes Relative: 6.3 %
Neutro Abs: 3628 cells/uL (ref 1500–7800)
Neutrophils Relative %: 51.1 %
Platelets: 277 10*3/uL (ref 140–400)
RBC: 5.26 10*6/uL — ABNORMAL HIGH (ref 3.80–5.10)
RDW: 13.1 % (ref 11.0–15.0)
Total Lymphocyte: 39.2 %
WBC: 7.1 10*3/uL (ref 3.8–10.8)

## 2022-05-12 LAB — MICROALBUMIN / CREATININE URINE RATIO
Creatinine, Urine: 95 mg/dL (ref 20–275)
Microalb Creat Ratio: 3 mg/g creat (ref <30)
Microalb, Ur: 0.3 mg/dL

## 2022-05-12 LAB — LIPID PANEL
Cholesterol: 111 mg/dL (ref <200)
HDL: 38 mg/dL — ABNORMAL LOW (ref >=50)
LDL Cholesterol (Calc): 48 mg/dL (calc)
Non-HDL Cholesterol (Calc): 73 mg/dL (calc) (ref <130)
Total CHOL/HDL Ratio: 2.9 (calc) (ref <5.0)
Triglycerides: 176 mg/dL — ABNORMAL HIGH (ref <150)

## 2022-05-12 LAB — COMPLETE METABOLIC PANEL WITH GFR
AG Ratio: 1.5 (calc) (ref 1.0–2.5)
ALT: 19 U/L (ref 6–29)
AST: 18 U/L (ref 10–35)
Albumin: 4.3 g/dL (ref 3.6–5.1)
Alkaline phosphatase (APISO): 86 U/L (ref 37–153)
BUN: 8 mg/dL (ref 7–25)
CO2: 26 mmol/L (ref 20–32)
Calcium: 9.1 mg/dL (ref 8.6–10.4)
Chloride: 104 mmol/L (ref 98–110)
Creat: 0.66 mg/dL (ref 0.50–1.05)
Globulin: 2.8 g/dL (calc) (ref 1.9–3.7)
Glucose, Bld: 174 mg/dL — ABNORMAL HIGH (ref 65–99)
Potassium: 4.7 mmol/L (ref 3.5–5.3)
Sodium: 139 mmol/L (ref 135–146)
Total Bilirubin: 0.4 mg/dL (ref 0.2–1.2)
Total Protein: 7.1 g/dL (ref 6.1–8.1)
eGFR: 95 mL/min/{1.73_m2} (ref >=60)

## 2022-05-12 LAB — HEMOGLOBIN A1C
Hgb A1c MFr Bld: 8.3 % of total Hgb — ABNORMAL HIGH (ref <5.7)
Mean Plasma Glucose: 192 mg/dL
eAG (mmol/L): 10.6 mmol/L

## 2022-05-13 ENCOUNTER — Ambulatory Visit
Admission: RE | Admit: 2022-05-13 | Discharge: 2022-05-13 | Disposition: A | Discharge status: Home / Self Care | Payer: Medicare HMO | Source: Ambulatory Visit | Attending: Internal Medicine | Admitting: Internal Medicine

## 2022-05-13 DIAGNOSIS — Z1231 Encounter for screening mammogram for malignant neoplasm of breast: Secondary | ICD-10-CM | POA: Insufficient documentation

## 2022-07-30 ENCOUNTER — Ambulatory Visit: Payer: Medicare HMO

## 2022-08-10 NOTE — Progress Notes (Unsigned)
Established Patient Office Visit  Subjective:  Patient ID: Krystal Boyd, female    DOB: [birth year]  Age: 67 y.o. MRN: [id number]  CC:  No chief complaint on file.   HPI Krystal Boyd presents for follow up on chronic medical conditions. Interpreter present to help with communication. Patient wanting to discuss a few things today. First is pain in her third and fourth digits on her right hand. She denies trauma but states it is worse with gripping and trying to open things. She is right handed. She has tried Voltaren gel which did not work and Tylenol as well.   Also is having some vaginal pain on the outside, mainly after wiping or with sex. Occasionally will have some mild bleeding after sex. Denies urinary symptoms. Using an over the counter hyaluronic acid gel but not noticing much change.   Is also having issues with constipation. Having a BM every 2-3 days but has a lot of straining and feels incomplete emptying. Stools are small and dry when passed. Tried fiber supplements but doesn't like the taste. Will also take magnesium citrate as needed but will cause diarrhea. Denies blood in stools but does sometimes have abdominal pain and even rectal pain with straining.   Hypertension: -Medications: Losartan 12.5 mg daily  -Patient is generally compliant with above medications and reports no side effects. -Checking BP at home (average): 127/82 average  -Denies any SOB, CP, vision changes, LE edema or symptoms of hypotension.    Diabetes type 2: -Last A1c 4/24 8.3% -Currently on Metformin 500 increased to BID ? -Patient is compliant with the above medications and reports no side effects but does not want to be on this medication long-term -Diet: made diet change to reduce sugars and carbs, now exercising by walking and riding her bike -Eye exam: Due -Foot exam: Due -Microalbumin: UTD 4/24 -Statin: yes -PNA vaccine: yes -Denies symptoms of hypoglycemia, polyuria, polydipsia, numbness  extremities, foot ulcers/trauma.   HLD: -Medications: Crestor 10 mg -Patient is compliant with above medications and reports no side effects. -Last lipid panel: Lipid Panel     Component Value Date/Time   CHOL 111 05/11/2022 0904   CHOL 181 03/16/2019 0855   TRIG 176 (H) 05/11/2022 0904   HDL 38 (L) 05/11/2022 0904   HDL 41 03/16/2019 0855   CHOLHDL 2.9 05/11/2022 0904   LDLCALC 48 05/11/2022 0904   LABVLDL 25 03/16/2019 0855    The ASCVD Risk score (Arnett DK, et al., 2019) failed to calculate for the following reasons:   The valid total cholesterol range is 130 to 320 mg/dL  Health Maintenance: -Blood work UTD -Mammogram 5/24 -Colon cancer screening - colonoscopy in [place] less than 10 years ago, uncertain when to repeat   Past Medical History:  Diagnosis Date   Hypertension     Past Surgical History:  Procedure Laterality Date   BLADDER REMOVAL     CHOLECYSTECTOMY     TUBAL LIGATION      Family History  Problem Relation Age of Onset   Diabetes Mother    Breast cancer Sister     Social History   Socioeconomic History   Marital status: Significant Other    Spouse name: Not on file   Number of children: Not on file   Years of education: Not on file   Highest education level: Associate degree: occupational, Scientist, product/process development, or vocational program  Occupational History   Not on file  Tobacco Use   Smoking status: Never   Smokeless  tobacco: Never  Vaping Use   Vaping status: Never Used  Substance and Sexual Activity   Alcohol use: Yes    Comment: occ   Drug use: Never   Sexual activity: Yes  Other Topics Concern   Not on file  Social History Narrative   Not on file   Social Determinants of Health   Financial Resource Strain: Low Risk  (08/07/2022)   Overall Financial Resource Strain (CARDIA)    Difficulty of Paying Living Expenses: Not hard at all  Food Insecurity: No Food Insecurity (08/07/2022)   Hunger Vital Sign    Worried About Running Out of Food in  the Last Year: Never true    Ran Out of Food in the Last Year: Never true  Transportation Needs: No Transportation Needs (08/07/2022)   PRAPARE - Administrator, Civil Service (Medical): No    Lack of Transportation (Non-Medical): No  Physical Activity: Sufficiently Active (08/07/2022)   Exercise Vital Sign    Days of Exercise per Week: 4 days    Minutes of Exercise per Session: 60 min  Stress: No Stress Concern Present (08/07/2022)   [employer] of Occupational Health - Occupational Stress Questionnaire    Feeling of Stress : Not at all  Social Connections: Moderately Integrated (08/07/2022)   Social Connection and Isolation Panel [NHANES]    Frequency of Communication with Friends and Family: More than three times a week    Frequency of Social Gatherings with Friends and Family: Once a week    Attends Religious Services: 1 to 4 times per year    Active Member of [employer] or Organizations: No    Attends Engineer, structural: Not on file    Marital Status: Married  Catering manager Violence: Not on file    Outpatient Medications Prior to Visit  Medication Sig Dispense Refill   Blood Pressure Monitoring (ADULT BLOOD PRESSURE CUFF LG) KIT 1 each by Does not apply route daily. 1 kit 0   conjugated estrogens (PREMARIN) vaginal cream Place 1 Applicatorful vaginally daily as needed (vaginal dryness). 42.5 g 12   losartan (COZAAR) 25 MG tablet TAKE 1 TABLET (25 MG TOTAL) BY MOUTH DAILY. 90 tablet 1   metFORMIN (GLUCOPHAGE) 500 MG tablet TAKE 1 TABLET BY MOUTH 2 TIMES DAILY WITH A MEAL. 180 tablet 1   Multiple Vitamins-Minerals (MULTIVITAMIN WITH MINERALS) tablet Take 1 tablet by mouth daily. 90 tablet 3   rosuvastatin (CRESTOR) 10 MG tablet Take 1 tablet (10 mg total) by mouth daily. 90 tablet 3   No facility-administered medications prior to visit.    No Known Allergies  ROS Review of Systems  Constitutional:  Negative for chills and fever.  Eyes:  Negative for  visual disturbance.  Respiratory:  Negative for shortness of breath.   Cardiovascular:  Negative for chest pain.  Gastrointestinal:  Positive for abdominal pain and constipation. Negative for blood in stool and diarrhea.  Genitourinary:  Positive for dyspareunia.  Neurological:  Negative for dizziness, syncope and headaches.      Objective:    Physical Exam Constitutional:      Appearance: Normal appearance.  HENT:     Head: Normocephalic and atraumatic.  Eyes:     Conjunctiva/sclera: Conjunctivae normal.  Cardiovascular:     Rate and Rhythm: Normal rate and regular rhythm.  Pulmonary:     Effort: Pulmonary effort is normal.     Breath sounds: Normal breath sounds.  Skin:    General: Skin is warm  and dry.  Neurological:     General: No focal deficit present.     Mental Status: She is alert. Mental status is at baseline.  Psychiatric:        Mood and Affect: Mood normal.        Behavior: Behavior normal.     There were no vitals taken for this visit. Wt Readings from Last 3 Encounters:  05/11/22 177 lb 1.6 oz (80.3 kg)  11/11/21 174 lb 1.6 oz (79 kg)  08/11/21 169 lb 8 oz (76.9 kg)     Health Maintenance Due  Topic Date Due   Medicare Annual Wellness (AWV)  Never done   COVID-19 Vaccine (1 - 2023-24 season) Never done   OPHTHALMOLOGY EXAM  02/26/2022   FOOT EXAM  03/05/2022    There are no preventive care reminders to display for this patient.  Lab Results  Component Value Date   TSH 3.78 08/11/2021   Lab Results  Component Value Date   WBC 7.1 05/11/2022   HGB 14.1 05/11/2022   HCT 43.8 05/11/2022   MCV 83.3 05/11/2022   PLT 277 05/11/2022   Lab Results  Component Value Date   NA 139 05/11/2022   K 4.7 05/11/2022   CO2 26 05/11/2022   GLUCOSE 174 (H) 05/11/2022   BUN 8 05/11/2022   CREATININE 0.66 05/11/2022   BILITOT 0.4 05/11/2022   ALKPHOS 96 03/16/2019   AST 18 05/11/2022   ALT 19 05/11/2022   PROT 7.1 05/11/2022   ALBUMIN 4.2  03/16/2019   CALCIUM 9.1 05/11/2022   EGFR 95 05/11/2022   Lab Results  Component Value Date   CHOL 111 05/11/2022   Lab Results  Component Value Date   HDL 38 (L) 05/11/2022   Lab Results  Component Value Date   LDLCALC 48 05/11/2022   Lab Results  Component Value Date   TRIG 176 (H) 05/11/2022   Lab Results  Component Value Date   CHOLHDL 2.9 05/11/2022   Lab Results  Component Value Date   HGBA1C 8.3 (H) 05/11/2022      Assessment & Plan:   1. Primary hypertension: Chronic, blood pressure borderline today.  Labs due.  Continue losartan 25 mg daily.  - CBC w/Diff/Platelet - COMPLETE METABOLIC PANEL WITH GFR  2. Mixed hyperlipidemia: For lipid panel today.  Continue Crestor 10 mg daily.  - Lipid Profile  3. Type 2 diabetes mellitus with hyperglycemia, without long-term current use of insulin [facility]): Check A1c and microalbumin.  Continue metformin 500 mg daily.  - HgB A1c - Urine Microalbumin w/creat. ratio  4. Vaginal atrophy: Will try estrogen vaginal cream and a water-based lubricant with sexual activity.  - conjugated estrogens (PREMARIN) vaginal cream; Place 1 Applicatorful vaginally daily as needed (vaginal dryness).  Dispense: 42.5 g; Refill: 12  5. Constipation, unspecified constipation type: Discussed increasing oral hydration and fiber in the diet.  Also recommend over-the-counter fiber supplements including Benefiber and Metamucil.  Recommend starting MiraLAX 1 capful daily with the goal of 1 soft bowel movement a day.  Patient had colonoscopy done at [facility] in [place], however uncertain of which specific facility this was done.  Patient is going up there in May and will try to obtain records.  6. Arthritis: Can use Aleve as needed, checking kidney function today.  7. Encounter for screening mammogram for malignant neoplasm of breast: Mammogram ordered.  - MM 3D SCREENING MAMMOGRAM BILATERAL BREAST; Future  Patient expressed understanding and  is in agreement  with plan as outlined above. All questions were answered. Today, I spent 45 minutes on patient care, >50% of time was spent face-to-face with the patient.    Follow-up: No follow-ups on file.    Margarita Mail, DO

## 2022-08-11 ENCOUNTER — Ambulatory Visit (INDEPENDENT_AMBULATORY_CARE_PROVIDER_SITE_OTHER): Payer: Medicare HMO | Admitting: Internal Medicine

## 2022-08-11 ENCOUNTER — Other Ambulatory Visit: Payer: Self-pay | Admitting: Internal Medicine

## 2022-08-11 ENCOUNTER — Encounter: Payer: Self-pay | Admitting: Internal Medicine

## 2022-08-11 VITALS — BP 134/82 | HR 77 | Temp 98.1°F | Resp 16 | Ht 60.0 in | Wt 180.3 lb

## 2022-08-11 DIAGNOSIS — E1165 Type 2 diabetes mellitus with hyperglycemia: Secondary | ICD-10-CM

## 2022-08-11 DIAGNOSIS — Z7984 Long term (current) use of oral hypoglycemic drugs: Secondary | ICD-10-CM | POA: Diagnosis not present

## 2022-08-11 DIAGNOSIS — I10 Essential (primary) hypertension: Secondary | ICD-10-CM | POA: Diagnosis not present

## 2022-08-11 DIAGNOSIS — N952 Postmenopausal atrophic vaginitis: Secondary | ICD-10-CM

## 2022-08-11 LAB — POCT GLYCOSYLATED HEMOGLOBIN (HGB A1C): Hemoglobin A1C: 7.9 % — AB (ref 4.0–5.6)

## 2022-08-11 MED ORDER — LOSARTAN POTASSIUM 25 MG PO TABS: 25.0000 mg | ORAL_TABLET | Freq: Every day | ORAL | 1 refills | Status: DC

## 2022-08-11 MED ORDER — RYBELSUS 3 MG PO TABS
3.0000 mg | ORAL_TABLET | Freq: Every day | ORAL | 2 refills | Status: DC
Start: 2022-08-11 — End: 2022-11-09

## 2022-08-11 MED ORDER — ESTROGENS CONJUGATED 0.625 MG/GM VA CREA
1.0000 | TOPICAL_CREAM | Freq: Every day | VAGINAL | 12 refills | Status: AC | PRN
Start: 2022-08-11 — End: ?

## 2022-08-11 NOTE — Patient Instructions (Signed)
It was great seeing you today!  Plan discussed at today's visit: -A1c 7.9% today -Stop Metformin, start Rybelsus 3 mg with food in the morning  -Other medications refilled  Follow up in: 3 months   Take care and let us know if you have any questions or concerns prior to your next visit.  Dr. Caralee Ates

## 2022-08-12 ENCOUNTER — Other Ambulatory Visit: Payer: Self-pay | Admitting: Internal Medicine

## 2022-08-12 DIAGNOSIS — E782 Mixed hyperlipidemia: Secondary | ICD-10-CM

## 2022-08-12 NOTE — Telephone Encounter (Signed)
Requested Prescriptions  Pending Prescriptions Disp Refills   metFORMIN (GLUCOPHAGE) 500 MG tablet [Pharmacy Med Name: METFORMIN HCL 500 MG TABLET] 180 tablet 0    Sig: TAKE 1 TABLET BY MOUTH TWICE A DAY WITH A MEAL     Endocrinology:  Diabetes - Biguanides Failed - 08/11/2022  8:11 PM      Failed - B12 Level in normal range and within 720 days    Vitamin B-12  Date Value Ref Range Status  03/16/2019 647 232 - 1,245 pg/mL Final         Passed - Cr in normal range and within 360 days    Creat  Date Value Ref Range Status  05/11/2022 0.66 0.50 - 1.05 mg/dL Final   Creatinine, Urine  Date Value Ref Range Status  05/11/2022 95 20 - 275 mg/dL Final         Passed - HBA1C is between 0 and 7.9 and within 180 days    Hemoglobin A1C  Date Value Ref Range Status  08/11/2022 7.9 (A) 4.0 - 5.6 % Final   Hgb A1c MFr Bld  Date Value Ref Range Status  05/11/2022 8.3 (H) <5.7 % of total Hgb Final    Comment:    For someone without known diabetes, a hemoglobin A1c value of 6.5% or greater indicates that they may have  diabetes and this should be confirmed with a follow-up  test. . For someone with known diabetes, a value <7% indicates  that their diabetes is well controlled and a value  greater than or equal to 7% indicates suboptimal  control. A1c targets should be individualized based on  duration of diabetes, age, comorbid conditions, and  other considerations. . Currently, no consensus exists regarding use of hemoglobin A1c for diagnosis of diabetes for children. .          Passed - eGFR in normal range and within 360 days    GFR calc Af Amer  Date Value Ref Range Status  03/16/2019 108 >59 mL/min/1.73 Final   GFR calc non Af Amer  Date Value Ref Range Status  03/16/2019 93 >59 mL/min/1.73 Final   eGFR  Date Value Ref Range Status  05/11/2022 95 > OR = 60 mL/min/1.97m2 Final         Passed - Valid encounter within last 6 months    Recent Outpatient Visits            Yesterday Type 2 diabetes mellitus with hyperglycemia, without long-term current use of insulin [facility])   [place] [facility] Margarita Mail, DO   3 months ago Type 2 diabetes mellitus with hyperglycemia, without long-term current use of insulin [facility])   [place] [facility] Margarita Mail, DO   9 months ago Type 2 diabetes mellitus with hyperglycemia, without long-term current use of insulin [facility])   [place] [facility] Margarita Mail, DO   1 year ago Type 2 diabetes mellitus with hyperglycemia, without long-term current use of insulin [facility])   [place] [facility] Margarita Mail, DO   1 year ago Type 2 diabetes mellitus with hyperglycemia, without long-term current use of insulin [facility])   [place] [facility] Margarita Mail, DO       Future Appointments             In 3 months Margarita Mail, DO [place] [facility]            Passed - CBC within  normal limits and completed in the last 12 months    WBC  Date Value Ref Range Status  05/11/2022 7.1 3.8 - 10.8 Thousand/uL Final   RBC  Date Value Ref Range Status  05/11/2022 5.26 (H) 3.80 - 5.10 Million/uL Final   Hemoglobin  Date Value Ref Range Status  05/11/2022 14.1 11.7 - 15.5 g/dL Final  59/56/3875 64.3 11.1 - 15.9 g/dL Final   HCT  Date Value Ref Range Status  05/11/2022 43.8 35.0 - 45.0 % Final   Hematocrit  Date Value Ref Range Status  03/16/2019 43.5 34.0 - 46.6 % Final   MCHC  Date Value Ref Range Status  05/11/2022 32.2 32.0 - 36.0 g/dL Final   [facility]  Date Value Ref Range Status  05/11/2022 26.8 (L) 27.0 - 33.0 pg Final   MCV  Date Value Ref Range Status  05/11/2022 83.3 80.0 - 100.0 fL Final  03/16/2019 82 79 - 97 fL Final   No results found for: "PLTCOUNTKUC", "LABPLAT", "POCPLA" RDW  Date Value Ref Range Status  05/11/2022 13.1 11.0 - 15.0 % Final  03/16/2019  13.5 11.7 - 15.4 % Final

## 2022-08-12 NOTE — Telephone Encounter (Signed)
Requested Prescriptions  Pending Prescriptions Disp Refills   rosuvastatin (CRESTOR) 10 MG tablet [Pharmacy Med Name: ROSUVASTATIN CALCIUM 10 MG TAB] 90 tablet 0    Sig: TAKE 1 TABLET BY MOUTH EVERY DAY     Cardiovascular:  Antilipid - Statins 2 Failed - 08/12/2022  2:04 AM      Failed - Lipid Panel in normal range within the last 12 months    Cholesterol, Total  Date Value Ref Range Status  03/16/2019 181 100 - 199 mg/dL Final   Cholesterol  Date Value Ref Range Status  05/11/2022 111 <200 mg/dL Final   LDL Cholesterol (Calc)  Date Value Ref Range Status  05/11/2022 48 mg/dL (calc) Final    Comment:    Reference range: <100 . Desirable range <100 mg/dL for primary prevention;   <70 mg/dL for patients with CHD or diabetic patients  with > or = 2 CHD risk factors. Marland Kitchen LDL-C is now calculated using the Martin-Hopkins  calculation, which is a validated novel method providing  better accuracy than the Friedewald equation in the  estimation of LDL-C.  Horald Pollen et al. Lenox Ahr. 6578;469(62): 2061-2068  (http://education.[facility].com/faq/FAQ164)    HDL  Date Value Ref Range Status  05/11/2022 38 (L) > OR = 50 mg/dL Final  95/28/4132 41 >44 mg/dL Final   Triglycerides  Date Value Ref Range Status  05/11/2022 176 (H) <150 mg/dL Final         Passed - Cr in normal range and within 360 days    Creat  Date Value Ref Range Status  05/11/2022 0.66 0.50 - 1.05 mg/dL Final   Creatinine, Urine  Date Value Ref Range Status  05/11/2022 95 20 - 275 mg/dL Final         Passed - Patient is not pregnant      Passed - Valid encounter within last 12 months    Recent Outpatient Visits           Yesterday Type 2 diabetes mellitus with hyperglycemia, without long-term current use of insulin [facility])   [place] [facility] Margarita Mail, DO   3 months ago Type 2 diabetes mellitus with hyperglycemia, without long-term current use of insulin [facility])   [place]  [facility] Margarita Mail, DO   9 months ago Type 2 diabetes mellitus with hyperglycemia, without long-term current use of insulin [facility])   [place] [facility] Margarita Mail, DO   1 year ago Type 2 diabetes mellitus with hyperglycemia, without long-term current use of insulin [facility])   [place] [facility] Margarita Mail, DO   1 year ago Type 2 diabetes mellitus with hyperglycemia, without long-term current use of insulin [facility])   [place] [facility] Margarita Mail, DO       Future Appointments             In 3 months Margarita Mail, DO [facility]

## 2022-08-20 ENCOUNTER — Ambulatory Visit: Payer: Medicare HMO

## 2022-08-28 ENCOUNTER — Ambulatory Visit: Payer: Medicare HMO

## 2022-08-28 VITALS — BP 126/82 | Ht 60.0 in | Wt 180.9 lb

## 2022-08-28 DIAGNOSIS — Z0000 Encounter for general adult medical examination without abnormal findings: Secondary | ICD-10-CM

## 2022-08-28 NOTE — Patient Instructions (Signed)
[name] , Thank you for taking time to come for your Medicare Wellness Visit. I appreciate your ongoing commitment to your health goals. Please review the following plan we discussed and let me know if I can assist you in the future.   Referrals/Orders/Follow-Ups/Clinician Recommendations: none  This is a list of the screening recommended for you and due dates:  Health Maintenance  Topic Date Due   COVID-19 Vaccine (1 - 2023-24 season) Never done   Eye exam for diabetics  02/26/2022   Flu Shot  08/13/2022   Zoster (Shingles) Vaccine (1 of 2) 11/11/2022*   Hemoglobin A1C  02/11/2023   Yearly kidney function blood test for diabetes  05/11/2023   Yearly kidney health urinalysis for diabetes  05/11/2023   Complete foot exam   08/11/2023   Medicare Annual Wellness Visit  08/28/2023   Mammogram  05/12/2024   Colon Cancer Screening  03/12/2025   Pneumonia Vaccine  Completed   DEXA scan (bone density measurement)  Completed   Hepatitis C Screening  Completed   HPV Vaccine  Aged Out   DTaP/Tdap/Td vaccine  Discontinued  *Topic was postponed. The date shown is not the original due date.    Advanced directives: (Declined) Advance directive discussed with you today. Even though you declined this today, please call our office should you change your mind, and we can give you the proper paperwork for you to fill out.  Next Medicare Annual Wellness Visit scheduled for next year: Yes 09/03/2023 @ 3:15pm in person  Preventive Care 65 Years and Older, Female Preventive care refers to lifestyle choices and visits with your health care provider that can promote health and wellness. What does preventive care include? A yearly physical exam. This is also called an annual well check. Dental exams once or twice a year. Routine eye exams. Ask your health care provider how often you should have your eyes checked. Personal lifestyle choices, including: Daily care of your teeth and gums. Regular  physical activity. Eating a healthy diet. Avoiding tobacco and drug use. Limiting alcohol use. Practicing safe sex. Taking low-dose aspirin every day. Taking vitamin and mineral supplements as recommended by your health care provider. What happens during an annual well check? The services and screenings done by your health care provider during your annual well check will depend on your age, overall health, lifestyle risk factors, and family history of disease. Counseling  Your health care provider may ask you questions about your: Alcohol use. Tobacco use. Drug use. Emotional well-being. Home and relationship well-being. Sexual activity. Eating habits. History of falls. Memory and ability to understand (cognition). Work and work Astronomer. Reproductive health. Screening  You may have the following tests or measurements: Height, weight, and BMI. Blood pressure. Lipid and cholesterol levels. These may be checked every 5 years, or more frequently if you are over 57 years old. Skin check. Lung cancer screening. You may have this screening every year starting at age 12 if you have a 30-pack-year history of smoking and currently smoke or have quit within the past 15 years. Fecal occult blood test (FOBT) of the stool. You may have this test every year starting at age 75. Flexible sigmoidoscopy or colonoscopy. You may have a sigmoidoscopy every 5 years or a colonoscopy every 10 years starting at age 49. Hepatitis C blood test. Hepatitis B blood test. Sexually transmitted disease (STD) testing. Diabetes screening. This is done by checking your blood sugar (glucose) after you have not eaten for a while (  fasting). You may have this done every 1-3 years. Bone density scan. This is done to screen for osteoporosis. You may have this done starting at age 59. Mammogram. This may be done every 1-2 years. Talk to your health care provider about how often you should have regular mammograms. Talk  with your health care provider about your test results, treatment options, and if necessary, the need for more tests. Vaccines  Your health care provider may recommend certain vaccines, such as: Influenza vaccine. This is recommended every year. Tetanus, diphtheria, and acellular pertussis (Tdap, Td) vaccine. You may need a Td booster every 10 years. Zoster vaccine. You may need this after age 66. Pneumococcal 13-valent conjugate (PCV13) vaccine. One dose is recommended after age 44. Pneumococcal polysaccharide (PPSV23) vaccine. One dose is recommended after age 64. Talk to your health care provider about which screenings and vaccines you need and how often you need them. This information is not intended to replace advice given to you by your health care provider. Make sure you discuss any questions you have with your health care provider. Document Released: 01/25/2015 Document Revised: 09/18/2015 Document Reviewed: 10/30/2014 Elsevier Interactive Patient Education  2017 [employer].  Fall Prevention in the Home Falls can cause injuries. They can happen to people of all ages. There are many things you can do to make your home safe and to help prevent falls. What can I do on the outside of my home? Regularly fix the edges of walkways and driveways and fix any cracks. Remove anything that might make you trip as you walk through a door, such as a raised step or threshold. Trim any bushes or trees on the path to your home. Use bright outdoor lighting. Clear any walking paths of anything that might make someone trip, such as rocks or tools. Regularly check to see if handrails are loose or broken. Make sure that both sides of any steps have handrails. Any raised decks and porches should have guardrails on the edges. Have any leaves, snow, or ice cleared regularly. Use sand or salt on walking paths during winter. Clean up any spills in your garage right away. This includes oil or grease  spills. What can I do in the bathroom? Use night lights. Install grab bars by the toilet and in the tub and shower. Do not use towel bars as grab bars. Use non-skid mats or decals in the tub or shower. If you need to sit down in the shower, use a plastic, non-slip stool. Keep the floor dry. Clean up any water that spills on the floor as soon as it happens. Remove soap buildup in the tub or shower regularly. Attach bath mats securely with double-sided non-slip rug tape. Do not have throw rugs and other things on the floor that can make you trip. What can I do in the bedroom? Use night lights. Make sure that you have a light by your bed that is easy to reach. Do not use any sheets or blankets that are too big for your bed. They should not hang down onto the floor. Have a firm chair that has side arms. You can use this for support while you get dressed. Do not have throw rugs and other things on the floor that can make you trip. What can I do in the kitchen? Clean up any spills right away. Avoid walking on wet floors. Keep items that you use a lot in easy-to-reach places. If you need to reach something above you, use  a strong step stool that has a grab bar. Keep electrical cords out of the way. Do not use floor polish or wax that makes floors slippery. If you must use wax, use non-skid floor wax. Do not have throw rugs and other things on the floor that can make you trip. What can I do with my stairs? Do not leave any items on the stairs. Make sure that there are handrails on both sides of the stairs and use them. Fix handrails that are broken or loose. Make sure that handrails are as long as the stairways. Check any carpeting to make sure that it is firmly attached to the stairs. Fix any carpet that is loose or worn. Avoid having throw rugs at the top or bottom of the stairs. If you do have throw rugs, attach them to the floor with carpet tape. Make sure that you have a light switch at the  top of the stairs and the bottom of the stairs. If you do not have them, ask someone to add them for you. What else can I do to help prevent falls? Wear shoes that: Do not have high heels. Have rubber bottoms. Are comfortable and fit you well. Are closed at the toe. Do not wear sandals. If you use a stepladder: Make sure that it is fully opened. Do not climb a closed stepladder. Make sure that both sides of the stepladder are locked into place. Ask someone to hold it for you, if possible. Clearly mark and make sure that you can see: Any grab bars or handrails. First and last steps. Where the edge of each step is. Use tools that help you move around (mobility aids) if they are needed. These include: Canes. Walkers. Scooters. Crutches. Turn on the lights when you go into a dark area. Replace any light bulbs as soon as they burn out. Set up your furniture so you have a clear path. Avoid moving your furniture around. If any of your floors are uneven, fix them. If there are any pets around you, be aware of where they are. Review your medicines with your doctor. Some medicines can make you feel dizzy. This can increase your chance of falling. Ask your doctor what other things that you can do to help prevent falls. This information is not intended to replace advice given to you by your health care provider. Make sure you discuss any questions you have with your health care provider. Document Released: 10/25/2008 Document Revised: 06/06/2015 Document Reviewed: 02/02/2014 Elsevier Interactive Patient Education  2017 [employer].

## 2022-08-28 NOTE — Progress Notes (Signed)
Subjective:   Krystal Boyd is a 67 y.o. female who presents for an Initial Medicare Annual Wellness Visit. She presents with her husband Hose who declines the spanish interpreter via video/audio.   Visit Complete: In person  Patient Medicare AWV questionnaire was not completed by the patient.  Review of Systems    Cardiac Risk Factors include: advanced age (>75men, >47 women);diabetes mellitus;dyslipidemia;hypertension;obesity (BMI >30kg/m2)    Objective:    Today's Vitals   08/28/22 1514  BP: 126/82  Weight: 180 lb 14.4 oz (82.1 kg)  Height: 5' (1.524 m)   Body mass index is 35.33 kg/m.     08/28/2022    3:37 PM  Advanced Directives  Does Patient Have a Medical Advance Directive? No    Current Medications (verified) Outpatient Encounter Medications as of 08/28/2022  Medication Sig   conjugated estrogens (PREMARIN) vaginal cream Place 1 Applicatorful vaginally daily as needed (vaginal dryness).   losartan (COZAAR) 25 MG tablet Take 1 tablet (25 mg total) by mouth daily.   Multiple Vitamins-Minerals (MULTIVITAMIN WITH MINERALS) tablet Take 1 tablet by mouth daily.   rosuvastatin (CRESTOR) 10 MG tablet TAKE 1 TABLET BY MOUTH EVERY DAY   Semaglutide (RYBELSUS) 3 MG TABS Take 1 tablet (3 mg total) by mouth daily.   metFORMIN (GLUCOPHAGE) 500 MG tablet TAKE 1 TABLET BY MOUTH TWICE A DAY WITH A MEAL (Patient not taking: Reported on 08/28/2022)   No facility-administered encounter medications on file as of 08/28/2022.    Allergies (verified) Patient has no known allergies.   History: Past Medical History:  Diagnosis Date   Hypertension    Past Surgical History:  Procedure Laterality Date   BLADDER REMOVAL     CHOLECYSTECTOMY     TUBAL LIGATION     Family History  Problem Relation Age of Onset   Diabetes Mother    Breast cancer Sister    Social History   Socioeconomic History   Marital status: Significant Other    Spouse name: Not on file   Number of  children: Not on file   Years of education: Not on file   Highest education level: Associate degree: occupational, Scientist, product/process development, or vocational program  Occupational History   Not on file  Tobacco Use   Smoking status: Never   Smokeless tobacco: Never  Vaping Use   Vaping status: Never Used  Substance and Sexual Activity   Alcohol use: Yes    Comment: occ   Drug use: Never   Sexual activity: Yes  Other Topics Concern   Not on file  Social History Narrative   Not on file   Social Determinants of Health   Financial Resource Strain: Low Risk  (08/28/2022)   Overall Financial Resource Strain (CARDIA)    Difficulty of Paying Living Expenses: Not hard at all  Food Insecurity: No Food Insecurity (08/28/2022)   Hunger Vital Sign    Worried About Running Out of Food in the Last Year: Never true    Ran Out of Food in the Last Year: Never true  Transportation Needs: No Transportation Needs (08/28/2022)   PRAPARE - Administrator, Civil Service (Medical): No    Lack of Transportation (Non-Medical): No  Physical Activity: Sufficiently Active (08/28/2022)   Exercise Vital Sign    Days of Exercise per Week: 4 days    Minutes of Exercise per Session: 60 min  Stress: No Stress Concern Present (08/28/2022)   [employer] of Occupational Health - Occupational Stress Questionnaire  Feeling of Stress : Not at all  Social Connections: Moderately Integrated (08/28/2022)   Social Connection and Isolation Panel [NHANES]    Frequency of Communication with Friends and Family: More than three times a week    Frequency of Social Gatherings with Friends and Family: Once a week    Attends Religious Services: More than 4 times per year    Active Member of [employer] or Organizations: No    Attends Engineer, structural: Never    Marital Status: Married    Tobacco Counseling Counseling given: Not Answered  Clinical Intake:  Pre-visit preparation completed: Yes  Pain : No/denies  pain   BMI - recorded: 35.33 Nutritional Status: BMI > 30  Obese Nutritional Risks: None Diabetes: Yes CBG done?: No Did pt. bring in CBG monitor from home?: No  How often do you need to have someone help you when you read instructions, pamphlets, or other written materials from your doctor or pharmacy?: 1 - Never  Interpreter Needed?: No  Comments: lives with husband Information entered by :: B.Raygen Linquist,LPN   Activities of Daily Living    08/28/2022    3:21 PM 08/11/2022    8:49 AM  In your present state of health, do you have any difficulty performing the following activities:  Hearing? 0 0  Vision? 0 0  Difficulty concentrating or making decisions? 0 0  Walking or climbing stairs? 0 0  Dressing or bathing? 0 0  Doing errands, shopping? 0 0  Preparing Food and eating ? N   Using the Toilet? N   In the past six months, have you accidently leaked urine? N   Do you have problems with loss of bowel control? N   Managing your Medications? N   Managing your Finances? N   Housekeeping or managing your Housekeeping? N     Patient Care Team: Margarita Mail, DO as PCP - General (Internal Medicine)  Indicate any recent Medical Services you may have received from other than [facility] providers in the past year (date may be approximate).     Assessment:   This is a routine wellness examination for [facility]  Hearing/Vision screen Hearing Screening - Comments:: adequate Vision Screening - Comments:: Adequate with glasses Dr Roe Rutherford 10/07/22  Dietary issues and exercise activities discussed:     Goals Addressed   None    Depression Screen    08/28/2022    3:19 PM 08/11/2022    8:49 AM 05/11/2022    8:31 AM 11/11/2021    8:23 AM 08/11/2021    8:23 AM 05/06/2021    9:13 AM 03/05/2021    8:44 AM  PHQ 2/9 Scores  PHQ - 2 Score 0 0 0 0 0 0 0  PHQ- 9 Score  0 0  0 0 0    Fall Risk    08/28/2022    3:16 PM 08/11/2022    8:49 AM 07/28/2022   12:17 PM 05/11/2022    8:27  AM 11/11/2021    8:23 AM  Fall Risk   Falls in the past year? 0 0 0 0 0  Number falls in past yr: 0 0  0 0  Injury with Fall? 0 0  0 0  Risk for fall due to : No Fall Risks No Fall Risks   No Fall Risks  Follow up Education provided;Falls prevention discussed Falls prevention discussed;Education provided;Falls evaluation completed   Falls evaluation completed    MEDICARE RISK AT HOME:  Medicare Risk at Home -  08/28/22 1517     Any stairs in or around the home? No    If so, are there any without handrails? No    Home free of loose throw rugs in walkways, pet beds, electrical cords, etc? Yes    Adequate lighting in your home to reduce risk of falls? Yes    Life alert? No    Use of a cane, walker or w/c? No    Grab bars in the bathroom? No    Shower chair or bench in shower? No    Elevated toilet seat or a handicapped toilet? No             TIMED UP AND GO:  Was the test performed? Yes  Length of time to ambulate 10 feet: 10 sec Gait steady and fast without use of assistive device    Cognitive Function:        08/28/2022    3:24 PM  6CIT Screen  What Year? 0 points  What month? 0 points  What time? 0 points  Count back from 20 0 points  Months in reverse 0 points  Repeat phrase 0 points  Total Score 0 points    Immunizations Immunization History  Administered Date(s) Administered   PNEUMOCOCCAL CONJUGATE-20 02/05/2021    TDAP status: Up to date  Flu Vaccine status: Declined, Education has been provided regarding the importance of this vaccine but patient still declined. Advised may receive this vaccine at local pharmacy or Health Dept. Aware to provide a copy of the vaccination record if obtained from local pharmacy or Health Dept. Verbalized acceptance and understanding.  Pneumococcal vaccine status: Declined,  Education has been provided regarding the importance of this vaccine but patient still declined. Advised may receive this vaccine at local pharmacy or  Health Dept. Aware to provide a copy of the vaccination record if obtained from local pharmacy or Health Dept. Verbalized acceptance and understanding.   Covid-19 vaccine status: Completed vaccines  Qualifies for Shingles Vaccine? Yes   Zostavax completed No   Shingrix Completed?: No.    Education has been provided regarding the importance of this vaccine. Patient has been advised to call insurance company to determine out of pocket expense if they have not yet received this vaccine. Advised may also receive vaccine at local pharmacy or Health Dept. Verbalized acceptance and understanding.  Screening Tests Health Maintenance  Topic Date Due   COVID-19 Vaccine (1 - 2023-24 season) Never done   OPHTHALMOLOGY EXAM  02/26/2022   INFLUENZA VACCINE  08/13/2022   Zoster Vaccines- Shingrix (1 of 2) 11/11/2022 (Originally 10/15/2003)   HEMOGLOBIN A1C  02/11/2023   Diabetic kidney evaluation - eGFR measurement  05/11/2023   Diabetic kidney evaluation - Urine ACR  05/11/2023   FOOT EXAM  08/11/2023   Medicare Annual Wellness (AWV)  08/28/2023   MAMMOGRAM  05/12/2024   Colonoscopy  03/12/2025   Pneumonia Vaccine 105+ Years old  Completed   DEXA SCAN  Completed   Hepatitis C Screening  Completed   HPV VACCINES  Aged Out   DTaP/Tdap/Td  Discontinued    Health Maintenance  Health Maintenance Due  Topic Date Due   COVID-19 Vaccine (1 - 2023-24 season) Never done   OPHTHALMOLOGY EXAM  02/26/2022   INFLUENZA VACCINE  08/13/2022    Colorectal cancer screening: Type of screening: Colonoscopy. Completed yes. Repeat every 5-10 years  Mammogram status: Completed yes. Repeat every year  Bone Density status: Completed yes. Results reflect: Bone density results: NORMAL.  Repeat every 5 years.  Lung Cancer Screening: (Low Dose CT Chest recommended if Age 33-80 years, 20 pack-year currently smoking OR have quit w/in 15years.) does not qualify.   Lung Cancer Screening Referral: no  Additional  Screening:  Hepatitis C Screening: does not qualify; Completed yes  Vision Screening: Recommended annual ophthalmology exams for early detection of glaucoma and other disorders of the eye. Is the patient up to date with their annual eye exam?  Yes  Who is the provider or what is the name of the office in which the patient attends annual eye exams? Dr Santiago Bumpers If pt is not established with a provider, would they like to be referred to a provider to establish care? No .   Dental Screening: Recommended annual dental exams for proper oral hygiene  Diabetic Foot Exam: Diabetic Foot Exam: Completed yes  Community Resource Referral / Chronic Care Management: CRR required this visit?  No   CCM required this visit?  No     Plan:     I have personally reviewed and noted the following in the patient's chart:   Medical and social history Use of alcohol, tobacco or illicit drugs  Current medications and supplements including opioid prescriptions. Patient is not currently taking opioid prescriptions. Functional ability and status Nutritional status Physical activity Advanced directives List of other physicians Hospitalizations, surgeries, and ER visits in previous 12 months Vitals Screenings to include cognitive, depression, and falls Referrals and appointments  In addition, I have reviewed and discussed with patient certain preventive protocols, quality metrics, and best practice recommendations. A written personalized care plan for preventive services as well as general preventive health recommendations were provided to patient.     Sue Lush, LPN   1/61/0960   After Visit Summary: (MyChart) Due to this being a telephonic visit, the after visit summary with patients personalized plan was offered to patient via MyChart   Nurse Notes: Pt and husband Hose present for visit. They decline audio/video spanish interpreter.He relays he will interpret. Pt understood many of the  questions and answered "no" or "yes". Pt has no concerns or questions at this time.

## 2022-10-07 DIAGNOSIS — H35033 Hypertensive retinopathy, bilateral: Secondary | ICD-10-CM | POA: Diagnosis not present

## 2022-10-07 DIAGNOSIS — Z0100 Encounter for examination of eyes and vision without abnormal findings: Secondary | ICD-10-CM | POA: Diagnosis not present

## 2022-10-07 DIAGNOSIS — H524 Presbyopia: Secondary | ICD-10-CM | POA: Diagnosis not present

## 2022-11-06 ENCOUNTER — Other Ambulatory Visit: Payer: Self-pay | Admitting: Internal Medicine

## 2022-11-06 DIAGNOSIS — E1165 Type 2 diabetes mellitus with hyperglycemia: Secondary | ICD-10-CM

## 2022-11-06 NOTE — Telephone Encounter (Signed)
Requested medications are due for refill today.  yes  Requested medications are on the active medications list.  yes  Last refill. 08/11/2022 #30 2 rf  Future visit scheduled.   yes  Notes to clinic.  Medication not assigned to a protocol. Please review for refill.    Requested Prescriptions  Pending Prescriptions Disp Refills   RYBELSUS 3 MG TABS [Pharmacy Med Name: RYBELSUS 3 MG TABLET] 30 tablet 2    Sig: TAKE 1 TABLET BY MOUTH DAILY     Off-Protocol Failed - 11/06/2022  2:41 AM      Failed - Medication not assigned to a protocol, review manually.      Passed - Valid encounter within last 12 months    Recent Outpatient Visits           2 months ago Type 2 diabetes mellitus with hyperglycemia, without long-term current use of insulin [facility])   [place] [facility] Margarita Mail, DO   5 months ago Type 2 diabetes mellitus with hyperglycemia, without long-term current use of insulin [facility])   [place] [facility] Margarita Mail, DO   12 months ago Type 2 diabetes mellitus with hyperglycemia, without long-term current use of insulin [facility])   [place] [facility] Margarita Mail, DO   1 year ago Type 2 diabetes mellitus with hyperglycemia, without long-term current use of insulin [facility])   [place] [facility] Margarita Mail, DO   1 year ago Type 2 diabetes mellitus with hyperglycemia, without long-term current use of insulin [facility])   [place] [facility] Margarita Mail, DO       Future Appointments             In 5 days Margarita Mail, DO [facility], [facility]

## 2022-11-09 NOTE — Progress Notes (Unsigned)
Established Patient Office Visit  Subjective:  Patient ID: Krystal Boyd, female    DOB: 1955-08-09  Age: 67 y.o. MRN: [id number]  CC:  No chief complaint on file.   HPI [name] presents for follow up on chronic medical conditions. Interpreter present to help with communication.   Hypertension: -Medications: Losartan 25 mg  -Patient is generally compliant with above medications and reports no side effects. -Checking BP at home (average): not checking -Denies any SOB, CP, vision changes, LE edema or symptoms of hypotension.    Diabetes type 2: -Last A1c 7/24 7.9% -Medications: Metformin 500 mg BID, started on Rybelsus 3 mg at LOV -Diet: eating mainly healthy but having a hard time losing weight, would like something to help with this.  -Eye exam: Due - has an eye doctor, just needs to call and schedule -Foot exam: UTD 7/24 -Microalbumin: UTD 4/24 -Statin: yes -PNA vaccine: yes -Denies symptoms of hypoglycemia, polyuria, polydipsia, numbness extremities, foot ulcers/trauma.   HLD: -Medications: Crestor 10 mg -Patient is compliant with above medications and reports no side effects. -Last lipid panel: Lipid Panel     Component Value Date/Time   CHOL 111 05/11/2022 0904   CHOL 181 03/16/2019 0855   TRIG 176 (H) 05/11/2022 0904   HDL 38 (L) 05/11/2022 0904   HDL 41 03/16/2019 0855   CHOLHDL 2.9 05/11/2022 0904   LDLCALC 48 05/11/2022 0904   LABVLDL 25 03/16/2019 0855    Health Maintenance: -Blood work UTD -Mammogram 5/24 Birads-1 -Colon cancer screening - colonoscopy in [place] less than 10 years ago, uncertain when to repeat   Past Medical History:  Diagnosis Date   Hypertension     Past Surgical History:  Procedure Laterality Date   BLADDER REMOVAL     CHOLECYSTECTOMY     TUBAL LIGATION      Family History  Problem Relation Age of Onset   Diabetes Mother    Breast cancer Sister     Social History   Socioeconomic History   Marital status:  Significant Other    Spouse name: Not on file   Number of children: Not on file   Years of education: Not on file   Highest education level: Some college, no degree  Occupational History   Not on file  Tobacco Use   Smoking status: Never   Smokeless tobacco: Never  Vaping Use   Vaping status: Never Used  Substance and Sexual Activity   Alcohol use: Yes    Comment: occ   Drug use: Never   Sexual activity: Yes  Other Topics Concern   Not on file  Social History Narrative   Not on file   Social Determinants of Health   Financial Resource Strain: Low Risk  (11/09/2022)   Overall Financial Resource Strain (CARDIA)    Difficulty of Paying Living Expenses: Not very hard  Food Insecurity: No Food Insecurity (11/09/2022)   Hunger Vital Sign    Worried About Running Out of Food in the Last Year: Never true    Ran Out of Food in the Last Year: Never true  Transportation Needs: No Transportation Needs (11/09/2022)   PRAPARE - Administrator, Civil Service (Medical): No    Lack of Transportation (Non-Medical): No  Physical Activity: Sufficiently Active (11/09/2022)   Exercise Vital Sign    Days of Exercise per Week: 5 days    Minutes of Exercise per Session: 90 min  Stress: No Stress Concern Present (11/09/2022)   [employer]  of Occupational Health - Occupational Stress Questionnaire    Feeling of Stress : Not at all  Social Connections: Moderately Integrated (11/09/2022)   Social Connection and Isolation Panel [NHANES]    Frequency of Communication with Friends and Family: More than three times a week    Frequency of Social Gatherings with Friends and Family: Once a week    Attends Religious Services: 1 to 4 times per year    Active Member of [employer] or Organizations: No    Attends Banker Meetings: Never    Marital Status: Married  Catering manager Violence: Not At Risk (08/28/2022)   Humiliation, Afraid, Rape, and Kick questionnaire    Fear of  Current or Ex-Partner: No    Emotionally Abused: No    Physically Abused: No    Sexually Abused: No    Outpatient Medications Prior to Visit  Medication Sig Dispense Refill   conjugated estrogens (PREMARIN) vaginal cream Place 1 Applicatorful vaginally daily as needed (vaginal dryness). 42.5 g 12   losartan (COZAAR) 25 MG tablet Take 1 tablet (25 mg total) by mouth daily. 90 tablet 1   metFORMIN (GLUCOPHAGE) 500 MG tablet TAKE 1 TABLET BY MOUTH TWICE A DAY WITH A MEAL (Patient not taking: Reported on 08/28/2022) 180 tablet 0   Multiple Vitamins-Minerals (MULTIVITAMIN WITH MINERALS) tablet Take 1 tablet by mouth daily. 90 tablet 3   rosuvastatin (CRESTOR) 10 MG tablet TAKE 1 TABLET BY MOUTH EVERY DAY 90 tablet 0   Semaglutide (RYBELSUS) 3 MG TABS TAKE 1 TABLET BY MOUTH DAILY 30 tablet 0   No facility-administered medications prior to visit.    No Known Allergies  ROS Review of Systems  Constitutional:  Negative for chills and fever.  Eyes:  Negative for visual disturbance.  Respiratory:  Negative for shortness of breath.   Cardiovascular:  Negative for chest pain.  Gastrointestinal:  Negative for abdominal pain, blood in stool and diarrhea.      Objective:    Physical Exam Constitutional:      Appearance: Normal appearance.  HENT:     Head: Normocephalic and atraumatic.  Eyes:     Conjunctiva/sclera: Conjunctivae normal.  Cardiovascular:     Rate and Rhythm: Normal rate and regular rhythm.     Pulses:          Dorsalis pedis pulses are 2+ on the right side and 2+ on the left side.  Pulmonary:     Effort: Pulmonary effort is normal.     Breath sounds: Normal breath sounds.  Musculoskeletal:     Right lower leg: No edema.     Left lower leg: No edema.     Right foot: Normal range of motion. No deformity, bunion, Charcot foot, foot drop or prominent metatarsal heads.     Left foot: Normal range of motion. No deformity, bunion, Charcot foot, foot drop or prominent  metatarsal heads.  Feet:     Right foot:     Protective Sensation: 6 sites tested.  6 sites sensed.     Skin integrity: Skin integrity normal.     Toenail Condition: Right toenails are normal.     Left foot:     Protective Sensation: 6 sites tested.  6 sites sensed.     Skin integrity: Skin integrity normal.     Toenail Condition: Left toenails are normal.  Skin:    General: Skin is warm and dry.  Neurological:     General: No focal deficit present.  Mental Status: She is alert. Mental status is at baseline.  Psychiatric:        Mood and Affect: Mood normal.        Behavior: Behavior normal.     There were no vitals taken for this visit. Wt Readings from Last 3 Encounters:  08/28/22 180 lb 14.4 oz (82.1 kg)  08/11/22 180 lb 4.8 oz (81.8 kg)  05/11/22 177 lb 1.6 oz (80.3 kg)     Health Maintenance Due  Topic Date Due   OPHTHALMOLOGY EXAM  02/26/2022   INFLUENZA VACCINE  Never done   COVID-19 Vaccine (1 - 2023-24 season) Never done    There are no preventive care reminders to display for this patient.  Lab Results  Component Value Date   TSH 3.78 08/11/2021   Lab Results  Component Value Date   WBC 7.1 05/11/2022   HGB 14.1 05/11/2022   HCT 43.8 05/11/2022   MCV 83.3 05/11/2022   PLT 277 05/11/2022   Lab Results  Component Value Date   NA 139 05/11/2022   K 4.7 05/11/2022   CO2 26 05/11/2022   GLUCOSE 174 (H) 05/11/2022   BUN 8 05/11/2022   CREATININE 0.66 05/11/2022   BILITOT 0.4 05/11/2022   ALKPHOS 96 03/16/2019   AST 18 05/11/2022   ALT 19 05/11/2022   PROT 7.1 05/11/2022   ALBUMIN 4.2 03/16/2019   CALCIUM 9.1 05/11/2022   EGFR 95 05/11/2022   Lab Results  Component Value Date   CHOL 111 05/11/2022   Lab Results  Component Value Date   HDL 38 (L) 05/11/2022   Lab Results  Component Value Date   LDLCALC 48 05/11/2022   Lab Results  Component Value Date   TRIG 176 (H) 05/11/2022   Lab Results  Component Value Date   CHOLHDL 2.9  05/11/2022   Lab Results  Component Value Date   HGBA1C 7.9 (A) 08/11/2022      Assessment & Plan:   1. Type 2 diabetes mellitus with hyperglycemia, without long-term current use of insulin (HCC): A1c improved to 7.9% today despite weight gain and stopping Metformin 2 weeks ago. Patient interested in something that can help with weight loss, will try Rybelsus 3 mg. Foot exam today. Follow up in 3 months.   - HM Diabetes Foot Exam - POCT HgB A1C - Semaglutide (RYBELSUS) 3 MG TABS; Take 1 tablet (3 mg total) by mouth daily.  Dispense: 30 tablet; Refill: 2  2. Hypertension, unspecified type: Stable, well controlled. Refill Losartan 25 mg.   - losartan (COZAAR) 25 MG tablet; Take 1 tablet (25 mg total) by mouth daily.  Dispense: 90 tablet; Refill: 1  3. Vaginal atrophy: Stable, estrogen cream helping, refilled today.  - conjugated estrogens (PREMARIN) vaginal cream; Place 1 Applicatorful vaginally daily as needed (vaginal dryness).  Dispense: 42.5 g; Refill: 12   Follow-up: No follow-ups on file.    Margarita Mail, DO

## 2022-11-11 ENCOUNTER — Ambulatory Visit (INDEPENDENT_AMBULATORY_CARE_PROVIDER_SITE_OTHER): Payer: Medicare HMO | Admitting: Internal Medicine

## 2022-11-11 ENCOUNTER — Encounter: Payer: Self-pay | Admitting: Internal Medicine

## 2022-11-11 VITALS — BP 132/90 | HR 74 | Temp 98.1°F | Resp 18 | Ht 60.0 in | Wt 176.2 lb

## 2022-11-11 DIAGNOSIS — E782 Mixed hyperlipidemia: Secondary | ICD-10-CM | POA: Diagnosis not present

## 2022-11-11 DIAGNOSIS — Z7984 Long term (current) use of oral hypoglycemic drugs: Secondary | ICD-10-CM | POA: Diagnosis not present

## 2022-11-11 DIAGNOSIS — E1165 Type 2 diabetes mellitus with hyperglycemia: Secondary | ICD-10-CM

## 2022-11-11 DIAGNOSIS — I10 Essential (primary) hypertension: Secondary | ICD-10-CM | POA: Diagnosis not present

## 2022-11-11 LAB — POCT GLYCOSYLATED HEMOGLOBIN (HGB A1C): Hemoglobin A1C: 5.8 % — AB (ref 4.0–5.6)

## 2022-11-11 MED ORDER — RYBELSUS 3 MG PO TABS
1.0000 | ORAL_TABLET | Freq: Every day | ORAL | 1 refills | Status: DC
Start: 2022-11-11 — End: 2023-05-11

## 2022-11-11 MED ORDER — LOSARTAN POTASSIUM 25 MG PO TABS
25.0000 mg | ORAL_TABLET | Freq: Every day | ORAL | 1 refills | Status: DC
Start: 2022-11-11 — End: 2023-05-10

## 2022-11-11 MED ORDER — ROSUVASTATIN CALCIUM 5 MG PO TABS
5.0000 mg | ORAL_TABLET | Freq: Every day | ORAL | 1 refills | Status: DC
Start: 2022-11-11 — End: 2023-05-10

## 2022-11-13 ENCOUNTER — Other Ambulatory Visit: Payer: Self-pay | Admitting: Internal Medicine

## 2022-11-13 DIAGNOSIS — E782 Mixed hyperlipidemia: Secondary | ICD-10-CM

## 2022-11-13 NOTE — Telephone Encounter (Signed)
No longer current dosing of this medication  Requested Prescriptions  Pending Prescriptions Disp Refills   rosuvastatin (CRESTOR) 10 MG tablet [Pharmacy Med Name: ROSUVASTATIN CALCIUM 10 MG TAB] 90 tablet 0    Sig: TAKE 1 TABLET BY MOUTH EVERY DAY     Cardiovascular:  Antilipid - Statins 2 Failed - 11/13/2022  2:43 AM      Failed - Lipid Panel in normal range within the last 12 months    Cholesterol, Total  Date Value Ref Range Status  03/16/2019 181 100 - 199 mg/dL Final   Cholesterol  Date Value Ref Range Status  05/11/2022 111 <200 mg/dL Final   LDL Cholesterol (Calc)  Date Value Ref Range Status  05/11/2022 48 mg/dL (calc) Final    Comment:    Reference range: <100 . Desirable range <100 mg/dL for primary prevention;   <70 mg/dL for patients with CHD or diabetic patients  with > or = 2 CHD risk factors. Marland Kitchen LDL-C is now calculated using the Martin-Hopkins  calculation, which is a validated novel method providing  better accuracy than the Friedewald equation in the  estimation of LDL-C.  Horald Pollen et al. Lenox Ahr. 4098;119(14): 2061-2068  (http://education.[facility].com/faq/FAQ164)    HDL  Date Value Ref Range Status  05/11/2022 38 (L) > OR = 50 mg/dL Final  78/29/5621 41 >30 mg/dL Final   Triglycerides  Date Value Ref Range Status  05/11/2022 176 (H) <150 mg/dL Final         Passed - Cr in normal range and within 360 days    Creat  Date Value Ref Range Status  05/11/2022 0.66 0.50 - 1.05 mg/dL Final   Creatinine, Urine  Date Value Ref Range Status  05/11/2022 95 20 - 275 mg/dL Final         Passed - Patient is not pregnant      Passed - Valid encounter within last 12 months    Recent Outpatient Visits           2 days ago Type 2 diabetes mellitus with hyperglycemia, without long-term current use of insulin [facility])   [place] [facility] Margarita Mail, DO   3 months ago Type 2 diabetes mellitus with hyperglycemia, without  long-term current use of insulin [facility])   [facility] Margarita Mail, DO   6 months ago Type 2 diabetes mellitus with hyperglycemia, without long-term current use of insulin [facility])   [place] Margarita Mail, DO   1 year ago Type 2 diabetes mellitus with hyperglycemia, without long-term current use of insulin [facility])   [place] [facility] Margarita Mail, DO   1 year ago Type 2 diabetes mellitus with hyperglycemia, without long-term current use of insulin [facility])   [place] [facility] Margarita Mail, DO       Future Appointments             In 5 months Margarita Mail, DO [facility], [facility]

## 2023-04-08 ENCOUNTER — Encounter: Payer: Self-pay | Admitting: Internal Medicine

## 2023-05-10 ENCOUNTER — Ambulatory Visit (INDEPENDENT_AMBULATORY_CARE_PROVIDER_SITE_OTHER): Payer: Self-pay | Admitting: Internal Medicine

## 2023-05-10 ENCOUNTER — Other Ambulatory Visit: Payer: Self-pay

## 2023-05-10 VITALS — BP 128/80 | HR 80 | Temp 98.1°F | Resp 16 | Ht 60.0 in | Wt 175.5 lb

## 2023-05-10 DIAGNOSIS — E782 Mixed hyperlipidemia: Secondary | ICD-10-CM

## 2023-05-10 DIAGNOSIS — I10 Essential (primary) hypertension: Secondary | ICD-10-CM

## 2023-05-10 DIAGNOSIS — Z7984 Long term (current) use of oral hypoglycemic drugs: Secondary | ICD-10-CM | POA: Diagnosis not present

## 2023-05-10 DIAGNOSIS — E1165 Type 2 diabetes mellitus with hyperglycemia: Secondary | ICD-10-CM | POA: Diagnosis not present

## 2023-05-10 DIAGNOSIS — Z1231 Encounter for screening mammogram for malignant neoplasm of breast: Secondary | ICD-10-CM

## 2023-05-10 DIAGNOSIS — E1169 Type 2 diabetes mellitus with other specified complication: Secondary | ICD-10-CM | POA: Diagnosis not present

## 2023-05-10 DIAGNOSIS — E1159 Type 2 diabetes mellitus with other circulatory complications: Secondary | ICD-10-CM

## 2023-05-10 MED ORDER — ROSUVASTATIN CALCIUM 5 MG PO TABS: 5.0000 mg | ORAL_TABLET | Freq: Every day | ORAL | 1 refills | Status: DC

## 2023-05-10 MED ORDER — LOSARTAN POTASSIUM 25 MG PO TABS: 25.0000 mg | ORAL_TABLET | Freq: Every day | ORAL | 1 refills | Status: DC

## 2023-05-10 NOTE — Progress Notes (Signed)
 Established Patient Office Visit  Subjective:  Patient ID: Krystal Boyd, female    DOB: [birth year]  Age: 68 y.o. MRN: [id number]  CC:  Chief Complaint  Patient presents with   Medical Management of Chronic Issues    6 month recheck    HPI Krystal Boyd presents for follow up on chronic medical conditions. Her husband present to help with communication. Overall she is doing well and has no questions or concerns today.  Hypertension: -Medications: Losartan  25 mg  - hasn't taken yet today -Patient is generally compliant with above medications and reports no side effects. -Checking BP at home (average): not checking -Denies any SOB, CP, vision changes, LE edema or symptoms of hypotension.    Diabetes type 2: -Last A1c 10/24 5.8% -Medications: Rybelsus  3 mg, no longer on Metformin  -Diet: decreased sugar and carbs in the diet, eating more protein and vegetables. Has lost a few pounds since LOV. -Eye exam: Due - has an eye doctor, just needs to call and schedule -Foot exam: UTD 7/24 -Microalbumin: Due -Statin: yes -PNA vaccine: yes -Denies symptoms of hypoglycemia, polyuria, polydipsia, numbness extremities, foot ulcers/trauma.   HLD: -Medications: Crestor  5 mg -Patient is compliant with above medications and reports no side effects. -Last lipid panel: Lipid Panel     Component Value Date/Time   CHOL 111 05/11/2022 0904   CHOL 181 03/16/2019 0855   TRIG 176 (H) 05/11/2022 0904   HDL 38 (L) 05/11/2022 0904   HDL 41 03/16/2019 0855   CHOLHDL 2.9 05/11/2022 0904   LDLCALC 48 05/11/2022 0904   LABVLDL 25 03/16/2019 0855    Health Maintenance: -Blood work due -Mammogram 5/24 Birads-1; due -Colon cancer screening - colonoscopy in [place] less than 10 years ago, uncertain when to repeat  -Not interested in vaccines  Past Medical History:  Diagnosis Date   Hypertension     Past Surgical History:  Procedure Laterality Date   BLADDER REMOVAL     CHOLECYSTECTOMY     TUBAL  LIGATION      Family History  Problem Relation Age of Onset   Diabetes Mother    Breast cancer Sister     Social History   Socioeconomic History   Marital status: Significant Other    Spouse name: Not on file   Number of children: Not on file   Years of education: Not on file   Highest education level: Associate degree: academic program  Occupational History   Not on file  Tobacco Use   Smoking status: Never   Smokeless tobacco: Never  Vaping Use   Vaping status: Never Used  Substance and Sexual Activity   Alcohol use: Yes    Comment: occ   Drug use: Never   Sexual activity: Yes  Other Topics Concern   Not on file  Social History Narrative   Not on file   Social Drivers of Health   Financial Resource Strain: Low Risk  (05/10/2023)   Overall Financial Resource Strain (CARDIA)    Difficulty of Paying Living Expenses: Not hard at all  Food Insecurity: No Food Insecurity (05/10/2023)   Hunger Vital Sign    Worried About Running Out of Food in the Last Year: Never true    Ran Out of Food in the Last Year: Never true  Transportation Needs: No Transportation Needs (05/10/2023)   PRAPARE - Administrator, Civil Service (Medical): No    Lack of Transportation (Non-Medical): No  Physical Activity: Insufficiently Active (05/10/2023)  Exercise Vital Sign    Days of Exercise per Week: 4 days    Minutes of Exercise per Session: 30 min  Stress: No Stress Concern Present (05/10/2023)   [employer] of Occupational Health - Occupational Stress Questionnaire    Feeling of Stress : Not at all  Social Connections: Moderately Isolated (05/10/2023)   Social Connection and Isolation Panel [NHANES]    Frequency of Communication with Friends and Family: Three times a week    Frequency of Social Gatherings with Friends and Family: Once a week    Attends Religious Services: 1 to 4 times per year    Active Member of [employer] or Organizations: No    Attends Banker  Meetings: Never    Marital Status: Divorced  Catering manager Violence: Not At Risk (08/28/2022)   Humiliation, Afraid, Rape, and Kick questionnaire    Fear of Current or Ex-Partner: No    Emotionally Abused: No    Physically Abused: No    Sexually Abused: No    Outpatient Medications Prior to Visit  Medication Sig Dispense Refill   conjugated estrogens  (PREMARIN ) vaginal cream Place 1 Applicatorful vaginally daily as needed (vaginal dryness). 42.5 g 12   losartan  (COZAAR ) 25 MG tablet Take 1 tablet (25 mg total) by mouth daily. 90 tablet 1   Multiple Vitamins-Minerals (MULTIVITAMIN WITH MINERALS) tablet Take 1 tablet by mouth daily. 90 tablet 3   rosuvastatin  (CRESTOR ) 5 MG tablet Take 1 tablet (5 mg total) by mouth daily. 90 tablet 1   Semaglutide  (RYBELSUS ) 3 MG TABS Take 1 tablet (3 mg total) by mouth daily. 90 tablet 1   No facility-administered medications prior to visit.    No Known Allergies  ROS Review of Systems  Constitutional:  Negative for chills and fever.  Eyes:  Negative for visual disturbance.  Respiratory:  Negative for shortness of breath.   Cardiovascular:  Negative for chest pain.  Gastrointestinal:  Negative for abdominal pain, blood in stool and diarrhea.      Objective:    Physical Exam Constitutional:      Appearance: Normal appearance.  HENT:     Head: Normocephalic and atraumatic.     Mouth/Throat:     Mouth: Mucous membranes are moist.     Pharynx: Oropharynx is clear.  Eyes:     Extraocular Movements: Extraocular movements intact.     Conjunctiva/sclera: Conjunctivae normal.     Pupils: Pupils are equal, round, and reactive to light.  Neck:     Comments: No thyromegaly  Cardiovascular:     Rate and Rhythm: Normal rate and regular rhythm.  Pulmonary:     Effort: Pulmonary effort is normal.     Breath sounds: Normal breath sounds.  Musculoskeletal:     Cervical back: No tenderness.  Lymphadenopathy:     Cervical: No cervical  adenopathy.  Skin:    General: Skin is warm and dry.  Neurological:     General: No focal deficit present.     Mental Status: She is alert. Mental status is at baseline.  Psychiatric:        Mood and Affect: Mood normal.        Behavior: Behavior normal.     BP 128/80 (Cuff Size: Large)   Pulse 80   Temp 98.1 F (36.7 C) (Oral)   Resp 16   Ht 5' (1.524 m)   Wt 175 lb 8 oz (79.6 kg)   SpO2 96%   BMI 34.27 kg/m  Vitals:   05/10/23 0920  BP: 128/80    Wt Readings from Last 3 Encounters:  05/10/23 175 lb 8 oz (79.6 kg)  11/11/22 176 lb 3.2 oz (79.9 kg)  08/28/22 180 lb 14.4 oz (82.1 kg)     Health Maintenance Due  Topic Date Due   Zoster Vaccines- Shingrix (1 of 2) Never done   OPHTHALMOLOGY EXAM  02/26/2022   COVID-19 Vaccine (3 - 2024-25 season) 09/13/2022   Diabetic kidney evaluation - eGFR measurement  05/11/2023   Diabetic kidney evaluation - Urine ACR  05/11/2023    There are no preventive care reminders to display for this patient.  Lab Results  Component Value Date   TSH 3.78 08/11/2021   Lab Results  Component Value Date   WBC 7.1 05/11/2022   HGB 14.1 05/11/2022   HCT 43.8 05/11/2022   MCV 83.3 05/11/2022   PLT 277 05/11/2022   Lab Results  Component Value Date   NA 139 05/11/2022   K 4.7 05/11/2022   CO2 26 05/11/2022   GLUCOSE 174 (H) 05/11/2022   BUN 8 05/11/2022   CREATININE 0.66 05/11/2022   BILITOT 0.4 05/11/2022   ALKPHOS 96 03/16/2019   AST 18 05/11/2022   ALT 19 05/11/2022   PROT 7.1 05/11/2022   ALBUMIN 4.2 03/16/2019   CALCIUM  9.1 05/11/2022   EGFR 95 05/11/2022   Lab Results  Component Value Date   CHOL 111 05/11/2022   Lab Results  Component Value Date   HDL 38 (L) 05/11/2022   Lab Results  Component Value Date   LDLCALC 48 05/11/2022   Lab Results  Component Value Date   TRIG 176 (H) 05/11/2022   Lab Results  Component Value Date   CHOLHDL 2.9 05/11/2022   Lab Results  Component Value Date   HGBA1C  5.8 (A) 11/11/2022      Assessment & Plan:   Type 2 diabetes mellitus with hyperglycemia, without long-term current use of insulin (HCC) Assessment & Plan: Stable, now just on Rybelsus  3 mg, no longer on Metformin . A1c and microalbumin due, refill medication once results are reviewed.   Orders: -     Hemoglobin A1c -     Microalbumin / creatinine urine ratio  Hypertension, unspecified type Assessment & Plan: Blood pressure stable here today, no changes made to medications and appropriate refills sent to pharmacy. Labs due.   Orders: -     CBC with Differential/Platelet -     COMPLETE METABOLIC PANEL WITHOUT GFR -     Losartan  Potassium; Take 1 tablet (25 mg total) by mouth daily.  Dispense: 90 tablet; Refill: 1  Mixed hyperlipidemia Assessment & Plan: Recheck lipid panel, continue statin.   Orders: -     Lipid panel -     Rosuvastatin  Calcium ; Take 1 tablet (5 mg total) by mouth daily.  Dispense: 90 tablet; Refill: 1  Encounter for screening mammogram for malignant neoplasm of breast -     3D Screening Mammogram, Left and Right; Future     Follow-up: Return in about 6 months (around 11/09/2023).    Rockney Cid, DO

## 2023-05-10 NOTE — Assessment & Plan Note (Signed)
 Stable, now just on Rybelsus  3 mg, no longer on Metformin . A1c and microalbumin due, refill medication once results are reviewed.

## 2023-05-10 NOTE — Assessment & Plan Note (Signed)
 Blood pressure stable here today, no changes made to medications and appropriate refills sent to pharmacy. Labs due.

## 2023-05-10 NOTE — Assessment & Plan Note (Signed)
 Recheck lipid panel, continue statin

## 2023-05-11 ENCOUNTER — Encounter: Payer: Self-pay | Admitting: Internal Medicine

## 2023-05-11 LAB — COMPLETE METABOLIC PANEL WITHOUT GFR
AG Ratio: 1.5 (calc) (ref 1.0–2.5)
ALT: 27 U/L (ref 6–29)
AST: 24 U/L (ref 10–35)
Albumin: 4.3 g/dL (ref 3.6–5.1)
Alkaline phosphatase (APISO): 80 U/L (ref 37–153)
BUN: 12 mg/dL (ref 7–25)
CO2: 28 mmol/L (ref 20–32)
Calcium: 9.2 mg/dL (ref 8.6–10.4)
Chloride: 102 mmol/L (ref 98–110)
Creat: 0.61 mg/dL (ref 0.50–1.05)
Globulin: 2.8 g/dL (ref 1.9–3.7)
Glucose, Bld: 132 mg/dL — ABNORMAL HIGH (ref 65–99)
Potassium: 4.9 mmol/L (ref 3.5–5.3)
Sodium: 137 mmol/L (ref 135–146)
Total Bilirubin: 0.3 mg/dL (ref 0.2–1.2)
Total Protein: 7.1 g/dL (ref 6.1–8.1)

## 2023-05-11 LAB — CBC WITH DIFFERENTIAL/PLATELET
Absolute Lymphocytes: 2509 {cells}/uL (ref 850–3900)
Absolute Monocytes: 462 {cells}/uL (ref 200–950)
Basophils Absolute: 41 {cells}/uL (ref 0–200)
Basophils Relative: 0.6 %
Eosinophils Absolute: 109 {cells}/uL (ref 15–500)
Eosinophils Relative: 1.6 %
HCT: 42.9 % (ref 35.0–45.0)
Hemoglobin: 13.7 g/dL (ref 11.7–15.5)
MCH: 26.8 pg — ABNORMAL LOW (ref 27.0–33.0)
MCHC: 31.9 g/dL — ABNORMAL LOW (ref 32.0–36.0)
MCV: 84 fL (ref 80.0–100.0)
MPV: 11 fL (ref 7.5–12.5)
Monocytes Relative: 6.8 %
Neutro Abs: 3679 {cells}/uL (ref 1500–7800)
Neutrophils Relative %: 54.1 %
Platelets: 276 10*3/uL (ref 140–400)
RBC: 5.11 10*6/uL — ABNORMAL HIGH (ref 3.80–5.10)
RDW: 13.2 % (ref 11.0–15.0)
Total Lymphocyte: 36.9 %
WBC: 6.8 10*3/uL (ref 3.8–10.8)

## 2023-05-11 LAB — LIPID PANEL
Cholesterol: 143 mg/dL (ref <200)
HDL: 44 mg/dL — ABNORMAL LOW (ref >=50)
LDL Cholesterol (Calc): 76 mg/dL
Non-HDL Cholesterol (Calc): 99 mg/dL (ref <130)
Total CHOL/HDL Ratio: 3.3 (calc) (ref <5.0)
Triglycerides: 152 mg/dL — ABNORMAL HIGH (ref <150)

## 2023-05-11 LAB — MICROALBUMIN / CREATININE URINE RATIO
Creatinine, Urine: 67 mg/dL (ref 20–275)
Microalb Creat Ratio: 4 mg/g{creat} (ref <30)
Microalb, Ur: 0.3 mg/dL

## 2023-05-11 LAB — HEMOGLOBIN A1C
Hgb A1c MFr Bld: 7.2 % — ABNORMAL HIGH (ref <5.7)
Mean Plasma Glucose: 160 mg/dL
eAG (mmol/L): 8.9 mmol/L

## 2023-05-11 MED ORDER — RYBELSUS 7 MG PO TABS: 7.0000 mg | ORAL_TABLET | Freq: Every day | ORAL | 1 refills | Status: DC

## 2023-05-11 NOTE — Addendum Note (Signed)
 Addended by: Rockney Cid on: 05/11/2023 08:05 AM   Modules accepted: Orders

## 2023-06-03 ENCOUNTER — Ambulatory Visit
Admission: RE | Admit: 2023-06-03 | Discharge: 2023-06-03 | Disposition: A | Discharge status: Home / Self Care | Source: Ambulatory Visit | Attending: Internal Medicine | Admitting: Internal Medicine

## 2023-06-03 DIAGNOSIS — Z1231 Encounter for screening mammogram for malignant neoplasm of breast: Secondary | ICD-10-CM | POA: Insufficient documentation

## 2023-06-07 ENCOUNTER — Encounter: Payer: Self-pay | Admitting: Internal Medicine

## 2023-06-08 MED ORDER — BLOOD GLUCOSE TEST STRIPS 333 VI STRP: ORAL_STRIP | 5 refills | Status: AC

## 2023-06-08 MED ORDER — BLOOD GLUCOSE MONITORING 333 DEVI: 0 refills | Status: AC

## 2023-06-08 MED ORDER — LANCETS MISC: 5 refills | Status: AC

## 2023-06-08 NOTE — Addendum Note (Signed)
 Addended by: Tsosie Gail on: 06/08/2023 02:40 PM   Modules accepted: Orders

## 2023-06-09 ENCOUNTER — Ambulatory Visit: Payer: Self-pay | Admitting: Internal Medicine

## 2023-10-05 ENCOUNTER — Encounter: Payer: Self-pay | Admitting: Physician Assistant

## 2023-10-05 ENCOUNTER — Telehealth: Admitting: Physician Assistant

## 2023-10-05 DIAGNOSIS — Z20828 Contact with and (suspected) exposure to other viral communicable diseases: Secondary | ICD-10-CM

## 2023-10-05 DIAGNOSIS — R6889 Other general symptoms and signs: Secondary | ICD-10-CM

## 2023-10-05 MED ORDER — LIDOCAINE VISCOUS HCL 2 % MT SOLN: 15.0000 mL | OROMUCOSAL | 0 refills | Status: AC | PRN

## 2023-10-05 MED ORDER — BENZONATATE 100 MG PO CAPS: 100.0000 mg | ORAL_CAPSULE | Freq: Three times a day (TID) | ORAL | 0 refills | Status: DC | PRN

## 2023-10-05 MED ORDER — ALBUTEROL SULFATE HFA 108 (90 BASE) MCG/ACT IN AERS: 2.0000 | INHALATION_SPRAY | Freq: Four times a day (QID) | RESPIRATORY_TRACT | 0 refills | Status: AC | PRN

## 2023-10-05 MED ORDER — OSELTAMIVIR PHOSPHATE 75 MG PO CAPS: 75.0000 mg | ORAL_CAPSULE | Freq: Two times a day (BID) | ORAL | 0 refills | Status: AC

## 2023-10-05 NOTE — Patient Instructions (Signed)
 Krystal Boyd, thank you for joining Elsie Velma Lunger, PA-C for today's virtual visit.  While this provider is not your primary care provider (PCP), if your PCP is located in our provider database this encounter information will be shared with them immediately following your visit.   A [facility] MyChart account gives you access to today's visit and all your visits, tests, and labs performed at [facility]  click here if you don't have a [facility] MyChart account or go to [email or web address]  Consent: (Patient) Krystal Boyd provided verbal consent for this virtual visit at the beginning of the encounter.  Current Medications:  Current Outpatient Medications:    albuterol  (VENTOLIN  HFA) 108 (90 Base) MCG/ACT inhaler, Inhale 2 puffs into the lungs every 6 (six) hours as needed for wheezing or shortness of breath., Disp: 8 g, Rfl: 0   benzonatate  (TESSALON ) 100 MG capsule, Take 1 capsule (100 mg total) by mouth 3 (three) times daily as needed for cough., Disp: 30 capsule, Rfl: 0   lidocaine  (XYLOCAINE ) 2 % solution, Use as directed 15 mLs in the mouth or throat as needed for mouth pain., Disp: 200 mL, Rfl: 0   oseltamivir  (TAMIFLU ) 75 MG capsule, Take 1 capsule (75 mg total) by mouth 2 (two) times daily for 5 days., Disp: 10 capsule, Rfl: 0   Blood Glucose Monitoring Suppl (BLOOD GLUCOSE MONITORING 333) DEVI, Check blood glucose BID DX e11.65, Disp: 1 each, Rfl: 0   conjugated estrogens  (PREMARIN ) vaginal cream, Place 1 Applicatorful vaginally daily as needed (vaginal dryness)., Disp: 42.5 g, Rfl: 12   Glucose Blood (BLOOD GLUCOSE TEST STRIPS 333) STRP, Check blood glucose BID DX e11.65, Disp: 100 strip, Rfl: 5   Lancets MISC, Check blood glucose BID DX e11.65, Disp: 100 each, Rfl: 5   losartan  (COZAAR ) 25 MG tablet, Take 1 tablet (25 mg total) by mouth daily., Disp: 90 tablet, Rfl: 1   Multiple Vitamins-Minerals (MULTIVITAMIN WITH MINERALS) tablet, Take 1 tablet by mouth  daily., Disp: 90 tablet, Rfl: 3   rosuvastatin  (CRESTOR ) 5 MG tablet, Take 1 tablet (5 mg total) by mouth daily., Disp: 90 tablet, Rfl: 1   Semaglutide  (RYBELSUS ) 7 MG TABS, Take 1 tablet (7 mg total) by mouth daily., Disp: 90 tablet, Rfl: 1   Medications ordered in this encounter:  Meds ordered this encounter  Medications   benzonatate  (TESSALON ) 100 MG capsule    Sig: Take 1 capsule (100 mg total) by mouth 3 (three) times daily as needed for cough.    Dispense:  30 capsule    Refill:  0    Supervising Provider:   LAMPTEY, PHILIP O [[id number]]   albuterol  (VENTOLIN  HFA) 108 (90 Base) MCG/ACT inhaler    Sig: Inhale 2 puffs into the lungs every 6 (six) hours as needed for wheezing or shortness of breath.    Dispense:  8 g    Refill:  0    Supervising Provider:   BLAISE ALEENE KIDD [id number]   oseltamivir  (TAMIFLU ) 75 MG capsule    Sig: Take 1 capsule (75 mg total) by mouth 2 (two) times daily for 5 days.    Dispense:  10 capsule    Refill:  0    Supervising Provider:   LAMPTEY, PHILIP O [[id number]]   lidocaine  (XYLOCAINE ) 2 % solution    Sig: Use as directed 15 mLs in the mouth or throat as needed for mouth pain.    Dispense:  200 mL    Refill:  0  Supervising Provider:   BLAISE ALEENE KIDD [[id number]]     *If you need refills on other medications prior to your next appointment, please contact your pharmacy*  Follow-Up: Call back or seek an in-person evaluation if the symptoms worsen or if the condition fails to improve as anticipated.   Virtual Care [phone]  Other Instructions Please keep well-hydrated and try to get plenty of rest. If you have a humidifier, place it in the bedroom and run it at night. Start a saline nasal rinse for nasal congestion. You can consider use of a nasal steroid spray like Flonase or Nasacort OTC. You can alternate between Tylenol and Ibuprofen if needed for fever, body aches, headache and/or throat pain. Salt water-gargles and  chloraseptic spray can be very beneficial for sore throat. Mucinex-DM for congestion or cough. Please take all prescribed medications as directed.  Remain out of work until [employer] for 24 hours without a fever-reducing medication, and you are feeling better.  You should mask until symptoms are resolved.  If anything worsens despite treatment, you need to be evaluated in-person. Please do not delay care.  Influenza, Adult Influenza is also called the flu. It is an infection in the lungs, nose, and throat (respiratory tract). It spreads easily from person to person (is contagious). The flu causes symptoms that are like a cold, along with high fever and body aches. What are the causes? This condition is caused by the influenza virus. You can get the virus by: Breathing in droplets that are in the air after a person infected with the flu coughed or sneezed. Touching something that has the virus on it and then touching your mouth, nose, or eyes. What increases the risk? Certain things may make you more likely to get the flu. These include: Not washing your hands often. Having close contact with many people during cold and flu season. Touching your mouth, eyes, or nose without first washing your hands. Not getting a flu shot every year. You may have a higher risk for the flu, and serious problems, such as a lung infection (pneumonia), if you: Are older than 65. Are pregnant. Have a weakened disease-fighting system (immune system) because of a disease or because you are taking certain medicines. Have a long-term (chronic) condition, such as: Heart, kidney, or lung disease. Diabetes. Asthma. Have a liver disorder. Are very overweight (morbidly obese). Have anemia. What are the signs or symptoms? Symptoms usually begin suddenly and last 4-14 days. They may include: Fever and chills. Headaches, body aches, or muscle aches. Sore throat. Cough. Runny or stuffy (congested) nose. Feeling  discomfort in your chest. Not wanting to eat as much as normal. Feeling weak or tired. Feeling dizzy. Feeling sick to your stomach or throwing up. How is this treated? If the flu is found early, you can be treated with antiviral medicine. This can help to reduce how bad the illness is and how long it lasts. This may be given by mouth or through an IV tube. Taking care of yourself at home can help your symptoms get better. Your doctor may want you to: Take over-the-counter medicines. Drink plenty of fluids. The flu often goes away on its own. If you have very bad symptoms or other problems, you may be treated in a hospital. Follow these instructions at home:     Activity Rest as needed. Get plenty of sleep. Stay home from work or school as told by your doctor. Do not leave home until you do  not have a fever for 24 hours without taking medicine. Leave home only to go to your doctor. Eating and drinking Take an ORS (oral rehydration solution). This is a drink that is sold at pharmacies and stores. Drink enough fluid to keep your pee pale yellow. Drink clear fluids in small amounts as you are able. Clear fluids include: Water. Ice chips. Fruit juice mixed with water. Low-calorie sports drinks. Eat bland foods that are easy to digest. Eat small amounts as you are able. These foods include: Bananas. Applesauce. Rice. Lean meats. Toast. Crackers. Do not eat or drink: Fluids that have a lot of sugar or caffeine. Alcohol. Spicy or fatty foods. General instructions Take over-the-counter and prescription medicines only as told by your doctor. Use a cool mist humidifier to add moisture to the air in your home. This can make it easier for you to breathe. When using a cool mist humidifier, clean it daily. Empty water and replace with clean water. Cover your mouth and nose when you cough or sneeze. Wash your hands with soap and water often and for at least 20 seconds. This is also  important after you cough or sneeze. If you cannot use soap and water, use alcohol-based hand sanitizer. Keep all follow-up visits. How is this prevented?  Get a flu shot every year. You may get the flu shot in late summer, fall, or winter. Ask your doctor when you should get your flu shot. Avoid contact with people who are sick during fall and winter. This is cold and flu season. Contact a doctor if: You get new symptoms. You have: Chest pain. Watery poop (diarrhea). A fever. Your cough gets worse. You start to have more mucus. You feel sick to your stomach. You throw up. Get help right away if you: Have shortness of breath. Have trouble breathing. Have skin or nails that turn a bluish color. Have very bad pain or stiffness in your neck. Get a sudden headache. Get sudden pain in your face or ear. Cannot eat or drink without throwing up. These symptoms may represent a serious problem that is an emergency. Get medical help right away. Call your local emergency services (911 in the [place] Do not wait to see if the symptoms will go away. Do not drive yourself to the hospital. Summary Influenza is also called the flu. It is an infection in the lungs, nose, and throat. It spreads easily from person to person. Take over-the-counter and prescription medicines only as told by your doctor. Getting a flu shot every year is the best way to not get the flu. This information is not intended to replace advice given to you by your health care provider. Make sure you discuss any questions you have with your health care provider. Document Revised: 08/18/2019 Document Reviewed: 08/18/2019 Elsevier Patient Education  2023 Elsevier Inc.      If you have been instructed to have an in-person evaluation today at a local Urgent Care facility, please use the link below. It will take you to a list of all of our available [facility], including address, phone number and hours of  operation. Please do not delay care.  [facility]  If you or a family member do not have a primary care provider, use the link below to schedule a visit and establish care. When you choose a [facility] primary care physician or advanced practice provider, you gain a long-term partner in health. Find a Primary Care Provider  Learn more about [facility] in-office and virtual care options: [facility] - Get Care Now

## 2023-10-05 NOTE — Progress Notes (Signed)
 Virtual Visit Consent   Krystal Boyd, you are scheduled for a virtual visit with a [facility] provider today. Just as with appointments in the office, your consent must be obtained to participate. Your consent will be active for this visit and any virtual visit you may have with one of our providers in the next 365 days. If you have a MyChart account, a copy of this consent can be sent to you electronically.  As this is a virtual visit, video technology does not allow for your provider to perform a traditional examination. This may limit your provider's ability to fully assess your condition. If your provider identifies any concerns that need to be evaluated in person or the need to arrange testing (such as labs, EKG, etc.), we will make arrangements to do so. Although advances in technology are sophisticated, we cannot ensure that it will always work on either your end or our end. If the connection with a video visit is poor, the visit may have to be switched to a telephone visit. With either a video or telephone visit, we are not always able to ensure that we have a secure connection.  By engaging in this virtual visit, you consent to the provision of healthcare and authorize for your insurance to be billed (if applicable) for the services provided during this visit. Depending on your insurance coverage, you may receive a charge related to this service.  I need to obtain your verbal consent now. Are you willing to proceed with your visit today? Krystal Boyd has provided verbal consent on 10/05/2023 for a virtual visit (video or telephone). Krystal Boyd, [place]  Date: 10/05/2023 11:26 AM   Virtual Visit via Video Note   I, Krystal Boyd, connected with  Krystal Boyd  ([id number], November 20, 1955) on 10/05/23 at 11:15 AM EDT by a video-enabled telemedicine application and verified that I am speaking with the correct person using two identifiers.  Location: Patient: Virtual Visit Location  Patient: Home Provider: Virtual Visit Location Provider: Home Office Caregility Interpretor (Spanish):[id number]   I discussed the limitations of evaluation and management by telemedicine and the availability of in person appointments. The patient expressed understanding and agreed to proceed.    History of Present Illness: Krystal Boyd is a 68 y.o. who identifies as a female who was assigned female at birth, and is being seen today for abrupt onset of cough, dry mouth, sore throat, fever (Tmax 101). Notes body aches (diffuse and persistent) and some chest wall tenderness (with coughing only) but denies shortness of breath. Notes family member with same symptoms, diagnosed as the flu. Has not tested for COVID. Has taken OTC Tylenol Cold and Flu to help with symptoms. Notes this controls her fever (current temp at 99)    HPI: HPI  Problems:  Patient Active Problem List   Diagnosis Date Noted   Diabetes (HCC) 02/07/2021   HLD (hyperlipidemia) 02/07/2021   HTN (hypertension) 02/05/2021    Allergies: No Known Allergies Medications:  Current Outpatient Medications:    albuterol  (VENTOLIN  HFA) 108 (90 Base) MCG/ACT inhaler, Inhale 2 puffs into the lungs every 6 (six) hours as needed for wheezing or shortness of breath., Disp: 8 g, Rfl: 0   benzonatate  (TESSALON ) 100 MG capsule, Take 1 capsule (100 mg total) by mouth 3 (three) times daily as needed for cough., Disp: 30 capsule, Rfl: 0   lidocaine  (XYLOCAINE ) 2 % solution, Use as directed 15 mLs in the mouth or throat as needed for mouth pain.,  Disp: 200 mL, Rfl: 0   oseltamivir  (TAMIFLU ) 75 MG capsule, Take 1 capsule (75 mg total) by mouth 2 (two) times daily for 5 days., Disp: 10 capsule, Rfl: 0   Blood Glucose Monitoring Suppl (BLOOD GLUCOSE MONITORING 333) DEVI, Check blood glucose BID DX e11.65, Disp: 1 each, Rfl: 0   conjugated estrogens  (PREMARIN ) vaginal cream, Place 1 Applicatorful vaginally daily as needed (vaginal dryness)., Disp: 42.5 g,  Rfl: 12   Glucose Blood (BLOOD GLUCOSE TEST STRIPS 333) STRP, Check blood glucose BID DX e11.65, Disp: 100 strip, Rfl: 5   Lancets MISC, Check blood glucose BID DX e11.65, Disp: 100 each, Rfl: 5   losartan  (COZAAR ) 25 MG tablet, Take 1 tablet (25 mg total) by mouth daily., Disp: 90 tablet, Rfl: 1   Multiple Vitamins-Minerals (MULTIVITAMIN WITH MINERALS) tablet, Take 1 tablet by mouth daily., Disp: 90 tablet, Rfl: 3   rosuvastatin  (CRESTOR ) 5 MG tablet, Take 1 tablet (5 mg total) by mouth daily., Disp: 90 tablet, Rfl: 1   Semaglutide  (RYBELSUS ) 7 MG TABS, Take 1 tablet (7 mg total) by mouth daily., Disp: 90 tablet, Rfl: 1  Observations/Objective: Patient is well-developed, well-nourished in no acute distress.  Resting comfortably  at home.  Head is normocephalic, atraumatic.  No labored breathing.  Speech is clear and coherent with logical content.  Patient is alert and oriented at baseline.   Assessment and Plan: 1. Exposure to influenza (Primary) - oseltamivir  (TAMIFLU ) 75 MG capsule; Take 1 capsule (75 mg total) by mouth 2 (two) times daily for 5 days.  Dispense: 10 capsule; Refill: 0  2. Flu-like symptoms - benzonatate  (TESSALON ) 100 MG capsule; Take 1 capsule (100 mg total) by mouth 3 (three) times daily as needed for cough.  Dispense: 30 capsule; Refill: 0 - albuterol  (VENTOLIN  HFA) 108 (90 Base) MCG/ACT inhaler; Inhale 2 puffs into the lungs every 6 (six) hours as needed for wheezing or shortness of breath.  Dispense: 8 g; Refill: 0 - oseltamivir  (TAMIFLU ) 75 MG capsule; Take 1 capsule (75 mg total) by mouth 2 (two) times daily for 5 days.  Dispense: 10 capsule; Refill: 0 - lidocaine  (XYLOCAINE ) 2 % solution; Use as directed 15 mLs in the mouth or throat as needed for mouth pain.  Dispense: 200 mL; Refill: 0  Discussed giving history and concerns, not unreasonable to test for COVID as precaution, but will go ahead and treat for influenza giving classic symptoms and known exposure.  Supportive measures, OTC medications and Vitamin recommendations reviewed. Will start Tamiflu  per orders. Tessalon  per orders. Quarantine reviewed with patient.    Follow Up Instructions: I discussed the assessment and treatment plan with the patient. The patient was provided an opportunity to ask questions and all were answered. The patient agreed with the plan and demonstrated an understanding of the instructions.  A copy of instructions were sent to the patient via MyChart unless otherwise noted below.   The patient was advised to call back or seek an in-person evaluation if the symptoms worsen or if the condition fails to improve as anticipated.    Krystal Velma Lunger, PA-C

## 2023-10-08 ENCOUNTER — Emergency Department

## 2023-10-08 ENCOUNTER — Other Ambulatory Visit: Payer: Self-pay

## 2023-10-08 DIAGNOSIS — J189 Pneumonia, unspecified organism: Secondary | ICD-10-CM | POA: Diagnosis not present

## 2023-10-08 DIAGNOSIS — R0989 Other specified symptoms and signs involving the circulatory and respiratory systems: Secondary | ICD-10-CM | POA: Diagnosis not present

## 2023-10-08 DIAGNOSIS — J029 Acute pharyngitis, unspecified: Secondary | ICD-10-CM | POA: Insufficient documentation

## 2023-10-08 DIAGNOSIS — I10 Essential (primary) hypertension: Secondary | ICD-10-CM | POA: Diagnosis not present

## 2023-10-08 DIAGNOSIS — R0789 Other chest pain: Secondary | ICD-10-CM | POA: Diagnosis not present

## 2023-10-08 DIAGNOSIS — R918 Other nonspecific abnormal finding of lung field: Secondary | ICD-10-CM | POA: Diagnosis not present

## 2023-10-08 DIAGNOSIS — E119 Type 2 diabetes mellitus without complications: Secondary | ICD-10-CM | POA: Diagnosis not present

## 2023-10-08 DIAGNOSIS — R059 Cough, unspecified: Secondary | ICD-10-CM | POA: Diagnosis not present

## 2023-10-08 DIAGNOSIS — R0602 Shortness of breath: Secondary | ICD-10-CM | POA: Diagnosis not present

## 2023-10-08 NOTE — ED Triage Notes (Signed)
 Pt presents to ER from home with complaints of cough, shortness of breath. Per pt's spouse pt had a virtual visit on Wednesday and mg and was diagnosed with Flu, pt prescribed Oseltamivir  and Benzonate mg. Pt reports feeling short of breath. Pt reports productive cough white sputum.

## 2023-10-09 ENCOUNTER — Emergency Department
Admission: EM | Admit: 2023-10-09 | Discharge: 2023-10-09 | Disposition: A | Discharge status: Home / Self Care | Attending: Emergency Medicine | Admitting: Emergency Medicine

## 2023-10-09 DIAGNOSIS — R059 Cough, unspecified: Secondary | ICD-10-CM | POA: Diagnosis not present

## 2023-10-09 DIAGNOSIS — R0602 Shortness of breath: Secondary | ICD-10-CM | POA: Diagnosis not present

## 2023-10-09 DIAGNOSIS — R0789 Other chest pain: Secondary | ICD-10-CM

## 2023-10-09 DIAGNOSIS — J029 Acute pharyngitis, unspecified: Secondary | ICD-10-CM

## 2023-10-09 DIAGNOSIS — R051 Acute cough: Secondary | ICD-10-CM

## 2023-10-09 DIAGNOSIS — J189 Pneumonia, unspecified organism: Secondary | ICD-10-CM | POA: Diagnosis not present

## 2023-10-09 DIAGNOSIS — R52 Pain, unspecified: Secondary | ICD-10-CM

## 2023-10-09 DIAGNOSIS — R918 Other nonspecific abnormal finding of lung field: Secondary | ICD-10-CM | POA: Diagnosis not present

## 2023-10-09 LAB — CBC
HCT: 42.8 % (ref 36.0–46.0)
Hemoglobin: 13.8 g/dL (ref 12.0–15.0)
MCH: 26.3 pg (ref 26.0–34.0)
MCHC: 32.2 g/dL (ref 30.0–36.0)
MCV: 81.7 fL (ref 80.0–100.0)
Platelets: 285 K/uL (ref 150–400)
RBC: 5.24 MIL/uL — ABNORMAL HIGH (ref 3.87–5.11)
RDW: 13.7 % (ref 11.5–15.5)
WBC: 8.3 K/uL (ref 4.0–10.5)
nRBC: 0 % (ref 0.0–0.2)

## 2023-10-09 LAB — BASIC METABOLIC PANEL WITH GFR
Anion gap: 9 (ref 5–15)
BUN: 19 mg/dL (ref 8–23)
CO2: 26 mmol/L (ref 22–32)
Calcium: 9 mg/dL (ref 8.9–10.3)
Chloride: 100 mmol/L (ref 98–111)
Creatinine, Ser: 0.66 mg/dL (ref 0.44–1.00)
GFR, Estimated: >60 mL/min (ref >60)
Glucose, Bld: 136 mg/dL — ABNORMAL HIGH (ref 70–99)
Potassium: 3.8 mmol/L (ref 3.5–5.1)
Sodium: 135 mmol/L (ref 135–145)

## 2023-10-09 LAB — RESP PANEL BY RT-PCR (RSV, FLU A&B, COVID)  RVPGX2
Influenza A by PCR: NEGATIVE
Influenza B by PCR: NEGATIVE
Resp Syncytial Virus by PCR: NEGATIVE
SARS Coronavirus 2 by RT PCR: NEGATIVE

## 2023-10-09 LAB — TROPONIN I (HIGH SENSITIVITY): Troponin I (High Sensitivity): 9 ng/L (ref <18)

## 2023-10-09 LAB — BRAIN NATRIURETIC PEPTIDE: B Natriuretic Peptide: 12.8 pg/mL (ref 0.0–100.0)

## 2023-10-09 MED ORDER — CEFDINIR 300 MG PO CAPS: 300.0000 mg | ORAL_CAPSULE | Freq: Two times a day (BID) | ORAL | 0 refills | Status: AC

## 2023-10-09 MED ORDER — SODIUM CHLORIDE 0.9 % IV SOLN
1.0000 g | Freq: Once | INTRAVENOUS | Status: AC
  Administered 2023-10-09: 1 g via INTRAVENOUS
  Filled 2023-10-09: qty 10

## 2023-10-09 MED ORDER — SODIUM CHLORIDE 0.9 % IV SOLN
100.0000 mg | Freq: Once | INTRAVENOUS | Status: AC
  Administered 2023-10-09: 100 mg via INTRAVENOUS
  Filled 2023-10-09: qty 100

## 2023-10-09 MED ORDER — DOXYCYCLINE HYCLATE 100 MG PO TABS: 100.0000 mg | ORAL_TABLET | Freq: Two times a day (BID) | ORAL | 0 refills | Status: AC

## 2023-10-09 NOTE — ED Provider Notes (Signed)
 [facility] Provider Note    Event Date/Time   First MD Initiated Contact with Patient 10/09/23 [phone]     (approximate)   History   Cough and Shortness of Breath   HPI  Krystal Boyd is a 68 y.o. female with history of diabetes, hypertension, hyperlipidemia, presenting with cough and shortness of breath.  Per independent history from patient's family, she had a virtual visit on Wednesday and was diagnosed with flu, was prescribed Tamiflu  and benzonatate .  She reports productive cough with white sputum, states she when she has a coughing fit she feels short of breath, also has a sore throat, no shortness of breath right now.  Only has chest wall pain when she is coughing, no chest pain right now.  No leg swelling, no history of cancer or recent travel or surgeries, no fevers.  No history of blood clots or leg swelling.  Independent history obtained from family as above.  On independent chart review, she had a video visit with family med on 23 September, at that time was complaining about cough, sore throat, fever to Tmax 101, body aches and chest wall tenderness with coughing but denies shortness of breath.  Reported that she was exposed to family members with flu.  Was treated for flu with Tamiflu .   Spanish interpreter was used for encounter  Physical Exam   Triage Vital Signs: ED Triage Vitals  Encounter Vitals Group     BP 10/08/23 2348 (!) 183/91     Girls Systolic BP Percentile --      Girls Diastolic BP Percentile --      Boys Systolic BP Percentile --      Boys Diastolic BP Percentile --      Pulse Rate 10/08/23 2348 95     Resp 10/08/23 2348 16     Temp 10/08/23 2348 98.6 F (37 C)     Temp Source 10/08/23 2348 Oral     SpO2 10/08/23 2348 95 %     Weight 10/08/23 2349 160 lb (72.6 kg)     Height 10/08/23 2349 5' (1.524 m)     Head Circumference --      Peak Flow --      Pain Score 10/08/23 2348 0     Pain Loc --      Pain Education --       Exclude from Growth Chart --     Most recent vital signs: Vitals:   10/08/23 2348 10/09/23 0335  BP: (!) 183/91 (!) 159/85  Pulse: 95 85  Resp: 16 17  Temp: 98.6 F (37 C) 98.4 F (36.9 C)  SpO2: 95% 96%     General: Awake, no distress.  CV:  Good peripheral perfusion.  Resp:  Normal effort.  No tachypnea or respiratory distress Abd:  No distention.  Soft nontender Other:  No unilateral calf swelling or tenderness, no lower extremity edema.   ED Results / Procedures / Treatments   Labs (all labs ordered are listed, but only abnormal results are displayed) Labs Reviewed  BASIC METABOLIC PANEL WITH GFR - Abnormal; Notable for the following components:      Result Value   Glucose, Bld 136 (*)    All other components within normal limits  CBC - Abnormal; Notable for the following components:   RBC 5.24 (*)    All other components within normal limits  RESP PANEL BY RT-PCR (RSV, FLU A&B, COVID)  RVPGX2  BRAIN NATRIURETIC PEPTIDE  TROPONIN I (HIGH SENSITIVITY)     EKG  EKG shows, sinus rhythm, rate of 94, normal QS, normal QTc, no obvious ischemic ST elevation, no prior to compare   RADIOLOGY On my independent interpretation, chest x-ray shows bibasilar opacities.   PROCEDURES:  Critical Care performed: No  Procedures   MEDICATIONS ORDERED IN ED: Medications  doxycycline (VIBRAMYCIN) 100 mg in sodium chloride 0.9 % 250 mL IVPB (100 mg Intravenous New Bag/Given 10/09/23 0416)  cefTRIAXone (ROCEPHIN) 1 g in sodium chloride 0.9 % 100 mL IVPB (0 g Intravenous Stopped 10/09/23 0418)     IMPRESSION / MDM / ASSESSMENT AND PLAN / ED COURSE  I reviewed the triage vital signs and the nursing notes.                              Differential diagnosis includes, but is not limited to, viral illness, influenza, RSV, COVID, pneumonia, atypical ACS, did consider PE but patient has no other risk factors.  Also reporting infectious symptoms, making viral illness versus  pneumonia higher on the differential.  Will get labs, EKG, troponin, chest x-ray, respiratory viral panel.  Patient's presentation is most consistent with acute presentation with potential threat to life or bodily function.  Independent interpretation of labs and imaging below.  Labs reassuring, chest x-ray is consistent with pneumonia, will start her on antibiotics here.  Considered but no indication for inpatient admission at this time, she safe for outpatient management.  She is not hypoxic or tachycardic.  Will have her follow-up with primary care doctor next week to get reassessed, send prescriptions for antibiotics to her pharmacy, instructed her to stop taking the Tamiflu  given that she does not have the flu.  Shared decision making patient and family and they are agreeable with this plan.  Discharged with strict return precautions.  The patient is on the cardiac monitor to evaluate for evidence of arrhythmia and/or significant heart rate changes.   Clinical Course as of 10/09/23 0441  Sat Oct 09, 2023  0335 Independent review of labs, electrolytes not severely deranged, no leukocytosis, respiratory viral panel is negative. [TT]  0335 DG Chest 2 View IMPRESSION: Low lung volumes with very mild bibasilar atelectasis and/or infiltrate.   [TT]  [id number] Troponin and BNP are not elevated. [TT]    Clinical Course User Index [TT] Waymond Lorelle Cummins, MD     FINAL CLINICAL IMPRESSION(S) / ED DIAGNOSES   Final diagnoses:  Acute cough  Community acquired pneumonia, unspecified laterality  Sore throat  Body aches  Chest wall pain     Rx / DC Orders   ED Discharge Orders          Ordered    cefdinir (OMNICEF) 300 MG capsule  2 times daily        10/09/23 0440    doxycycline (VIBRA-TABS) 100 MG tablet  2 times daily        10/09/23 0440             Note:  This document was prepared using Dragon voice recognition software and may include unintentional dictation errors.    Waymond Lorelle Cummins, MD 10/09/23 [phone]

## 2023-10-09 NOTE — Discharge Instructions (Signed)
 Please take the antibiotics as prescribed for your pneumonia, please stop taking the Tamiflu  since you do not have the flu.  Please make sure to follow-up with your primary care doctor next week to get reassessed.

## 2023-10-28 NOTE — Progress Notes (Signed)
 Krystal Boyd                                          MRN: [id number]   10/28/2023   The [facility] Quality Team Specialist reviewed this patient medical record for the purposes of chart review for care gap closure. The following were reviewed: chart review for care gap closure-diabetic eye exam.    [facility] Quality Team

## 2023-11-09 ENCOUNTER — Other Ambulatory Visit: Payer: Self-pay

## 2023-11-09 ENCOUNTER — Ambulatory Visit: Admitting: Internal Medicine

## 2023-11-09 ENCOUNTER — Encounter: Payer: Self-pay | Admitting: Internal Medicine

## 2023-11-09 VITALS — BP 130/80 | HR 73 | Temp 98.5°F | Resp 16 | Ht 60.0 in | Wt 170.9 lb

## 2023-11-09 DIAGNOSIS — Z7984 Long term (current) use of oral hypoglycemic drugs: Secondary | ICD-10-CM

## 2023-11-09 DIAGNOSIS — Z23 Encounter for immunization: Secondary | ICD-10-CM

## 2023-11-09 DIAGNOSIS — E782 Mixed hyperlipidemia: Secondary | ICD-10-CM | POA: Diagnosis not present

## 2023-11-09 DIAGNOSIS — I10 Essential (primary) hypertension: Secondary | ICD-10-CM | POA: Diagnosis not present

## 2023-11-09 DIAGNOSIS — E1165 Type 2 diabetes mellitus with hyperglycemia: Secondary | ICD-10-CM

## 2023-11-09 DIAGNOSIS — H6992 Unspecified Eustachian tube disorder, left ear: Secondary | ICD-10-CM

## 2023-11-09 DIAGNOSIS — K5904 Chronic idiopathic constipation: Secondary | ICD-10-CM | POA: Diagnosis not present

## 2023-11-09 LAB — POCT GLYCOSYLATED HEMOGLOBIN (HGB A1C): Hemoglobin A1C: 7 % — AB (ref 4.0–5.6)

## 2023-11-09 MED ORDER — ROSUVASTATIN CALCIUM 5 MG PO TABS: 5.0000 mg | ORAL_TABLET | Freq: Every day | ORAL | 1 refills | Status: AC

## 2023-11-09 MED ORDER — RYBELSUS 7 MG PO TABS: 7.0000 mg | ORAL_TABLET | Freq: Every day | ORAL | 1 refills | Status: AC

## 2023-11-09 MED ORDER — FLUTICASONE PROPIONATE 50 MCG/ACT NA SUSP: 2.0000 | Freq: Every day | NASAL | 6 refills | Status: AC

## 2023-11-09 MED ORDER — LOSARTAN POTASSIUM 25 MG PO TABS: 25.0000 mg | ORAL_TABLET | Freq: Every day | ORAL | 1 refills | Status: AC

## 2023-11-09 NOTE — Progress Notes (Signed)
 Established Patient Office Visit  Subjective:  Patient ID: Krystal Boyd, female    DOB: 07-01-1955  Age: 68 y.o. MRN: [id number]  CC:  Chief Complaint  Patient presents with   Medical Management of Chronic Issues    6 month recheck    HPI Krystal Boyd presents for follow up on chronic medical conditions. She is here with her husband today.   Discussed the use of AI scribe software for clinical note transcription with the patient, who gave verbal consent to proceed.  History of Present Illness Krystal Boyd is a 68 year old female with diabetes who presents for follow-up and management of her diabetes and constipation.  Her recent HbA1c is 7.0, improved from 7.2. She is on Rybelsus  7 mg, which was increased at her last visit, and is doing well. Her medications include losartan  and Crestor , continued at the same dosage.  She has experienced constipation for the past two weeks, described as 'hard constipation.' She occasionally uses Miralax. There are no symptoms of acid reflux or difficulty swallowing.  She experiences a sensation of 'water in the ear' when lying down. Two months ago, she had pneumonia in [place]  and was treated with antibiotics. She occasionally experiences back pain but has no recent fevers or respiratory symptoms.   Hypertension: -Medications: Losartan  25 mg  -Patient is generally compliant with above medications and reports no side effects. -Checking BP at home (average): not checking -Denies any SOB, CP, vision changes, LE edema or symptoms of hypotension.    Diabetes type 2: -Last A1c 4/25 7.2% -Medications: Rybelsus  7 mg, no longer on Metformin  -Diet: decreased sugar and carbs in the diet, eating more protein and vegetables. -Eye exam: UTD -Foot exam: Due today -Microalbumin: UTD -Statin: yes -PNA vaccine: yes -Denies symptoms of hypoglycemia, polyuria, polydipsia, numbness extremities, foot ulcers/trauma.   HLD: -Medications: Crestor  5  mg -Patient is compliant with above medications and reports no side effects. -Last lipid panel: Lipid Panel     Component Value Date/Time   CHOL 143 05/10/2023 0948   CHOL 181 03/16/2019 0855   TRIG 152 (H) 05/10/2023 0948   HDL 44 (L) 05/10/2023 0948   HDL 41 03/16/2019 0855   CHOLHDL 3.3 05/10/2023 0948   LDLCALC 76 05/10/2023 0948   LABVLDL 25 03/16/2019 0855    Health Maintenance: -Blood work UTD  -Mammogram 5/25 Birads-1 -Colon cancer screening - colonoscopy in [place] 10/2014, repeat in 10 years -Flu vaccine today  Past Medical History:  Diagnosis Date   Hypertension     Past Surgical History:  Procedure Laterality Date   BLADDER REMOVAL     CHOLECYSTECTOMY     TUBAL LIGATION      Family History  Problem Relation Age of Onset   Diabetes Mother    Breast cancer Sister     Social History   Socioeconomic History   Marital status: Significant Other    Spouse name: Not on file   Number of children: Not on file   Years of education: Not on file   Highest education level: Master's degree (e.g., MA, MS, MEng, MEd, MSW, MBA)  Occupational History   Not on file  Tobacco Use   Smoking status: Never   Smokeless tobacco: Never  Vaping Use   Vaping status: Never Used  Substance and Sexual Activity   Alcohol use: Yes    Comment: occ   Drug use: Never   Sexual activity: Yes  Other Topics Concern   Not on file  Social History Narrative   Not on file   Social Drivers of Health   Financial Resource Strain: Low Risk  (11/06/2023)   Overall Financial Resource Strain (CARDIA)    Difficulty of Paying Living Expenses: Not hard at all  Food Insecurity: No Food Insecurity (11/06/2023)   Hunger Vital Sign    Worried About Running Out of Food in the Last Year: Never true    Ran Out of Food in the Last Year: Never true  Transportation Needs: No Transportation Needs (11/06/2023)   PRAPARE - Administrator, Civil Service (Medical): No    Lack of Transportation  (Non-Medical): No  Physical Activity: Insufficiently Active (11/06/2023)   Exercise Vital Sign    Days of Exercise per Week: 3 days    Minutes of Exercise per Session: 40 min  Stress: No Stress Concern Present (11/06/2023)   [employer] of Occupational Health - Occupational Stress Questionnaire    Feeling of Stress: Not at all  Social Connections: Moderately Isolated (11/06/2023)   Social Connection and Isolation Panel    Frequency of Communication with Friends and Family: More than three times a week    Frequency of Social Gatherings with Friends and Family: Once a week    Attends Religious Services: 1 to 4 times per year    Active Member of [employer] or Organizations: No    Attends Banker Meetings: Not on file    Marital Status: Widowed  Intimate Partner Violence: Not At Risk (08/28/2022)   Humiliation, Afraid, Rape, and Kick questionnaire    Fear of Current or Ex-Partner: No    Emotionally Abused: No    Physically Abused: No    Sexually Abused: No    Outpatient Medications Prior to Visit  Medication Sig Dispense Refill   albuterol  (VENTOLIN  HFA) 108 (90 Base) MCG/ACT inhaler Inhale 2 puffs into the lungs every 6 (six) hours as needed for wheezing or shortness of breath. 8 g 0   conjugated estrogens  (PREMARIN ) vaginal cream Place 1 Applicatorful vaginally daily as needed (vaginal dryness). 42.5 g 12   lidocaine  (XYLOCAINE ) 2 % solution Use as directed 15 mLs in the mouth or throat as needed for mouth pain. 200 mL 0   losartan  (COZAAR ) 25 MG tablet Take 1 tablet (25 mg total) by mouth daily. 90 tablet 1   Multiple Vitamins-Minerals (MULTIVITAMIN WITH MINERALS) tablet Take 1 tablet by mouth daily. 90 tablet 3   rosuvastatin  (CRESTOR ) 5 MG tablet Take 1 tablet (5 mg total) by mouth daily. 90 tablet 1   Semaglutide  (RYBELSUS ) 7 MG TABS Take 1 tablet (7 mg total) by mouth daily. 90 tablet 1   benzonatate  (TESSALON ) 100 MG capsule Take 1 capsule (100 mg total) by mouth 3  (three) times daily as needed for cough. (Patient not taking: Reported on 11/09/2023) 30 capsule 0   Blood Glucose Monitoring Suppl (BLOOD GLUCOSE MONITORING 333) DEVI Check blood glucose BID DX e11.65 1 each 0   Glucose Blood (BLOOD GLUCOSE TEST STRIPS 333) STRP Check blood glucose BID DX e11.65 100 strip 5   Lancets MISC Check blood glucose BID DX e11.65 100 each 5   No facility-administered medications prior to visit.    No Known Allergies  ROS Review of Systems  HENT:  Positive for ear pain.   Gastrointestinal:  Positive for constipation.      Objective:    Physical Exam Constitutional:      Appearance: Normal appearance.  HENT:  Head: Normocephalic and atraumatic.     Right Ear: Tympanic membrane, ear canal and external ear normal.     Left Ear: Ear canal and external ear normal.     Ears:     Comments: Left eustachian tube dysfunction  Eyes:     Conjunctiva/sclera: Conjunctivae normal.  Cardiovascular:     Rate and Rhythm: Normal rate and regular rhythm.     Pulses:          Dorsalis pedis pulses are 2+ on the right side and 2+ on the left side.  Pulmonary:     Effort: Pulmonary effort is normal.     Breath sounds: Normal breath sounds.  Musculoskeletal:     Right foot: Normal range of motion. No deformity, bunion, Charcot foot, foot drop or prominent metatarsal heads.     Left foot: Normal range of motion. No deformity, bunion, Charcot foot, foot drop or prominent metatarsal heads.  Feet:     Right foot:     Protective Sensation: 6 sites tested.  6 sites sensed.     Skin integrity: Skin integrity normal.     Toenail Condition: Right toenails are normal.     Left foot:     Protective Sensation: 6 sites tested.  6 sites sensed.     Skin integrity: Skin integrity normal.     Toenail Condition: Left toenails are normal.  Skin:    General: Skin is warm and dry.  Neurological:     General: No focal deficit present.     Mental Status: She is alert. Mental  status is at baseline.  Psychiatric:        Mood and Affect: Mood normal.        Behavior: Behavior normal.     BP 130/80 (Cuff Size: Large)   Pulse 73   Temp 98.5 F (36.9 C) (Oral)   Resp 16   Ht 5' (1.524 m)   Wt 170 lb 14.4 oz (77.5 kg)   SpO2 92%   BMI 33.38 kg/m   Vitals:   11/09/23 0859  BP: 130/80    Wt Readings from Last 3 Encounters:  11/09/23 170 lb 14.4 oz (77.5 kg)  10/09/23 160 lb (72.6 kg)  05/10/23 175 lb 8 oz (79.6 kg)     Health Maintenance Due  Topic Date Due   Zoster Vaccines- Shingrix (1 of 2) Never done   FOOT EXAM  08/11/2023   Influenza Vaccine  Never done   Medicare Annual Wellness (AWV)  08/28/2023   COVID-19 Vaccine (3 - 2025-26 season) 09/13/2023   HEMOGLOBIN A1C  11/09/2023    There are no preventive care reminders to display for this patient.  Lab Results  Component Value Date   TSH 3.78 08/11/2021   Lab Results  Component Value Date   WBC 8.3 10/08/2023   HGB 13.8 10/08/2023   HCT 42.8 10/08/2023   MCV 81.7 10/08/2023   PLT 285 10/08/2023   Lab Results  Component Value Date   NA 135 10/08/2023   K 3.8 10/08/2023   CO2 26 10/08/2023   GLUCOSE 136 (H) 10/08/2023   BUN 19 10/08/2023   CREATININE 0.66 10/08/2023   BILITOT 0.3 05/10/2023   ALKPHOS 96 03/16/2019   AST 24 05/10/2023   ALT 27 05/10/2023   PROT 7.1 05/10/2023   ALBUMIN 4.2 03/16/2019   CALCIUM  9.0 10/08/2023   ANIONGAP 9 10/08/2023   EGFR 95 05/11/2022   Lab Results  Component Value Date   CHOL  143 05/10/2023   Lab Results  Component Value Date   HDL 44 (L) 05/10/2023   Lab Results  Component Value Date   LDLCALC 76 05/10/2023   Lab Results  Component Value Date   TRIG 152 (H) 05/10/2023   Lab Results  Component Value Date   CHOLHDL 3.3 05/10/2023   Lab Results  Component Value Date   HGBA1C 7.2 (H) 05/10/2023      Assessment & Plan:   Assessment & Plan Constipation Chronic constipation exacerbated recently. Previous metformin   use caused GI side effects. Current Miralax use insufficient. - Advise daily Miralax, one capful, for one soft bowel movement per day. - Instruct to adjust Miralax dosage if loose stools occur. - Emphasize hydration and increased dietary fiber.  Type 2 diabetes mellitus Well-controlled with A1c of 7.0. Rybelsus  7 mg effective. - Continue Rybelsus  7 mg daily. - Refill Rybelsus  at [facility] on [address].  Hypertension -Blood pressure stable here today, no changes made to medications and appropriate refills sent to pharmacy.   Hyperlipidemia -Stable, doing well on statin, plan to recheck fasting labs at follow up. Refills ordered today.   Eustachian tube dysfunction Likely secondary to sinus pressure, possibly from recent pneumonia or allergies. No infection noted. - Prescribe Flonase nasal spray, two sprays each nostril twice daily while symptomatic. - Advise use on affected side if symptoms are unilateral.  - POCT HgB A1C - Semaglutide  (RYBELSUS ) 7 MG TABS; Take 1 tablet (7 mg total) by mouth daily.  Dispense: 90 tablet; Refill: 1 - [facility] Diabetes Foot Exam - losartan  (COZAAR ) 25 MG tablet; Take 1 tablet (25 mg total) by mouth daily.  Dispense: 90 tablet; Refill: 1 - rosuvastatin  (CRESTOR ) 5 MG tablet; Take 1 tablet (5 mg total) by mouth daily.  Dispense: 90 tablet; Refill: 1 - fluticasone (FLONASE) 50 MCG/ACT nasal spray; Place 2 sprays into both nostrils daily.  Dispense: 16 g; Refill: 6 - Flu vaccine HIGH DOSE PF(Fluzone Trivalent)    Follow-up: Return in about 6 months (around 05/09/2024).    Sharyle Fischer, DO

## 2023-12-19 DIAGNOSIS — R030 Elevated blood-pressure reading, without diagnosis of hypertension: Secondary | ICD-10-CM | POA: Diagnosis not present

## 2023-12-19 DIAGNOSIS — J069 Acute upper respiratory infection, unspecified: Secondary | ICD-10-CM | POA: Diagnosis not present

## 2023-12-23 NOTE — Progress Notes (Signed)
 Krystal Boyd                                          MRN: [id number]   12/23/2023   The [facility] Quality Team Specialist reviewed this patient medical record for the purposes of chart review for care gap closure. The following were reviewed: abstraction for care gap closure-glycemic status assessment.    [facility] Quality Team

## 2024-02-02 NOTE — Progress Notes (Signed)
 Marina Boerner                                          MRN: [id number]   02/02/2024   The [facility] Quality Team Specialist reviewed this patient medical record for the purposes of chart review for care gap closure. The following were reviewed: chart review for care gap closure-diabetic eye exam.    [facility] Quality Team

## 2024-05-09 ENCOUNTER — Ambulatory Visit: Admitting: Internal Medicine
